# Patient Record
Sex: Female | Born: 1955 | Race: White | Hispanic: Yes | Marital: Married | Smoking: Never smoker
Health system: Southern US, Community
[De-identification: ages and names within clinical notes are randomized; demographics above are authoritative.]

## PROBLEM LIST (undated history)

## (undated) DIAGNOSIS — E669 Obesity, unspecified: Secondary | ICD-10-CM

## (undated) DIAGNOSIS — F32A Depression, unspecified: Secondary | ICD-10-CM

## (undated) DIAGNOSIS — R42 Dizziness and giddiness: Secondary | ICD-10-CM

## (undated) DIAGNOSIS — E7800 Pure hypercholesterolemia, unspecified: Secondary | ICD-10-CM

## (undated) DIAGNOSIS — E1169 Type 2 diabetes mellitus with other specified complication: Secondary | ICD-10-CM

## (undated) DIAGNOSIS — M539 Dorsopathy, unspecified: Secondary | ICD-10-CM

## (undated) DIAGNOSIS — M1990 Unspecified osteoarthritis, unspecified site: Secondary | ICD-10-CM

## (undated) DIAGNOSIS — R9431 Abnormal electrocardiogram [ECG] [EKG]: Secondary | ICD-10-CM

## (undated) DIAGNOSIS — F329 Major depressive disorder, single episode, unspecified: Secondary | ICD-10-CM

## (undated) DIAGNOSIS — T753XXA Motion sickness, initial encounter: Secondary | ICD-10-CM

## (undated) HISTORY — DX: Abnormal electrocardiogram (ECG) (EKG): R94.31

## (undated) HISTORY — DX: Pure hypercholesterolemia, unspecified: E78.00

## (undated) HISTORY — DX: Type 2 diabetes mellitus with other specified complication: E11.69

## (undated) HISTORY — PX: ABDOMINAL HYSTERECTOMY: SHX81

## (undated) HISTORY — PX: EYE SURGERY: SHX253

## (undated) HISTORY — DX: Obesity, unspecified: E66.9

---

## 1998-10-05 HISTORY — PX: PARTIAL HYSTERECTOMY: SHX80

## 2005-02-16 ENCOUNTER — Ambulatory Visit: Payer: Self-pay

## 2005-02-16 ENCOUNTER — Ambulatory Visit: Payer: Self-pay | Admitting: Internal Medicine

## 2006-08-03 ENCOUNTER — Other Ambulatory Visit: Payer: Self-pay

## 2006-08-03 ENCOUNTER — Emergency Department: Payer: Self-pay | Admitting: General Practice

## 2014-01-26 LAB — HM COLONOSCOPY

## 2016-07-02 ENCOUNTER — Emergency Department
Admission: EM | Admit: 2016-07-02 | Discharge: 2016-07-02 | Disposition: A | Discharge status: Home / Self Care | Payer: 59 | Attending: Emergency Medicine | Admitting: Emergency Medicine

## 2016-07-02 ENCOUNTER — Encounter: Payer: Self-pay | Admitting: *Deleted

## 2016-07-02 DIAGNOSIS — M25551 Pain in right hip: Secondary | ICD-10-CM | POA: Insufficient documentation

## 2016-07-02 HISTORY — DX: Major depressive disorder, single episode, unspecified: F32.9

## 2016-07-02 HISTORY — DX: Depression, unspecified: F32.A

## 2016-07-02 MED ORDER — KETOROLAC TROMETHAMINE 60 MG/2ML IM SOLN
60.0000 mg | Freq: Once | INTRAMUSCULAR | Status: AC
  Administered 2016-07-02: 60 mg via INTRAMUSCULAR
  Filled 2016-07-02: qty 2

## 2016-07-02 MED ORDER — METHOCARBAMOL 750 MG PO TABS: 750.0000 mg | ORAL_TABLET | Freq: Four times a day (QID) | ORAL | 0 refills | Status: DC

## 2016-07-02 MED ORDER — OXYCODONE-ACETAMINOPHEN 5-325 MG PO TABS: 1.0000 | ORAL_TABLET | ORAL | 0 refills | Status: DC | PRN

## 2016-07-02 NOTE — ED Provider Notes (Signed)
[facility] Emergency Department Provider Note   ____________________________________________   First MD Initiated Contact with Patient 07/02/16 1227     (approximate)  I have reviewed the triage vital signs and the nursing notes.   HISTORY  Chief Complaint Hip Pain    HPI Anne Woodard is a 60 y.o. female resents for evaluationof continuous right hip pain radiating to her groin. Patient reports past medical history significant for chronic pain. Saw her PCP 3 weeks ago had x-rays which showed some degenerative changes otherwise unremarkable. States that the naproxen and tramadol are not helping her pain.   Past Medical History:  Diagnosis Date  . Depression     There are no active problems to display for this patient.   History reviewed. No pertinent surgical history.  Prior to Admission medications   Medication Sig Start Date End Date Taking? Authorizing Provider  methocarbamol (ROBAXIN) 750 MG tablet Take 1 tablet (750 mg total) by mouth 4 (four) times daily. 07/02/16   Evangeline Dakinharles M Thorin Starner, PA-C  oxyCODONE-acetaminophen (ROXICET) 5-325 MG tablet Take 1-2 tablets by mouth every 4 (four) hours as needed for severe pain. 07/02/16   Evangeline Dakinharles M Lexis Potenza, PA-C    Allergies Review of patient's allergies indicates not on file.  No family history on file.  Social History Social History  Substance Use Topics  . Smoking status: Never Smoker  . Smokeless tobacco: Not on file  . Alcohol use No    Review of Systems Constitutional: No fever/chills Musculoskeletal: Positive straight leg raise to the right. Increased pain with extension and abduction. Skin: Negative for rash. Neurological: Negative for headaches, focal weakness or numbness.  10-point ROS otherwise negative.  ____________________________________________   PHYSICAL EXAM:  VITAL SIGNS: ED Triage Vitals  Enc Vitals Group     BP 07/02/16 1134 (!) 187/90     Pulse Rate 07/02/16 1134 71      Resp 07/02/16 1134 20     Temp 07/02/16 1134 97.8 F (36.6 C)     Temp src --      SpO2 07/02/16 1134 98 %     Weight 07/02/16 1135 185 lb (83.9 kg)     Height 07/02/16 1135 5\' 2"  (1.575 m)     Head Circumference --      Peak Flow --      Pain Score 07/02/16 1131 10     Pain Loc --      Pain Edu? --      Excl. in GC? --     Constitutional: Alert and oriented. Well appearing and in no acute distress. Gastrointestinal: Soft and nontender. No distention. No abdominal bruits. No CVA tenderness. MusculoskeletalRight hip and pelvis with point tenderness within the right groin itself no pelvic rock tenderness. Pain increased with straight leg raise and abduction of the right leg. Neurologic:  Normal speech and language. No gross focal neurologic deficits are appreciated. No gait instability. Skin:  Skin is warm, dry and intact. No rash noted. Psychiatric: Mood and affect are normal. Speech and behavior are normal.  ____________________________________________   LABS (all labs ordered are listed, but only abnormal results are displayed)  Labs Reviewed - No data to display ____________________________________________  EKG   ____________________________________________  RADIOLOGY   ____________________________________________   PROCEDURES  Procedure(s) performed: None  Procedures  Critical Care performed: No  ____________________________________________   INITIAL IMPRESSION / ASSESSMENT AND PLAN / ED COURSE  Pertinent labs & imaging results that were available during my care  of the patient were reviewed by me and considered in my medical decision making (see chart for details).  Recurrent right hip pain. Reinforce affect the patient to follow-up with orthopedics for evaluation of continued right hip pain. Rx given for Percocet and Robaxin. Patient voices no other emergency medical complaints this time. She is to stop her tramadol.  Clinical Course      ____________________________________________   FINAL CLINICAL IMPRESSION(S) / ED DIAGNOSES  Final diagnoses:  Hip pain, acute, right      NEW MEDICATIONS STARTED DURING THIS VISIT:  New Prescriptions   METHOCARBAMOL (ROBAXIN) 750 MG TABLET    Take 1 tablet (750 mg total) by mouth 4 (four) times daily.   OXYCODONE-ACETAMINOPHEN (ROXICET) 5-325 MG TABLET    Take 1-2 tablets by mouth every 4 (four) hours as needed for severe pain.     Note:  This document was prepared using Dragon voice recognition software and may include unintentional dictation errors.   Evangeline Dakin, PA-C 07/02/16 1307    Sharman Cheek, MD 07/02/16 [id number]

## 2016-07-02 NOTE — ED Notes (Signed)
Patient stated pain is unchanged after injection. Advised patient to have prescriptions filled and take. Advised caution with sedation from medications.

## 2016-07-02 NOTE — ED Triage Notes (Signed)
Pt complains of right hip pain radiating to groin, pt reports chronic hip pain

## 2017-03-09 ENCOUNTER — Other Ambulatory Visit: Payer: Self-pay | Admitting: Internal Medicine

## 2017-03-09 DIAGNOSIS — Z1231 Encounter for screening mammogram for malignant neoplasm of breast: Secondary | ICD-10-CM

## 2017-03-23 ENCOUNTER — Ambulatory Visit
Admission: RE | Admit: 2017-03-23 | Discharge: 2017-03-23 | Disposition: A | Discharge status: Home / Self Care | Payer: 59 | Source: Ambulatory Visit | Attending: Internal Medicine | Admitting: Internal Medicine

## 2017-03-23 DIAGNOSIS — Z1231 Encounter for screening mammogram for malignant neoplasm of breast: Secondary | ICD-10-CM | POA: Diagnosis present

## 2017-03-25 ENCOUNTER — Other Ambulatory Visit: Payer: Self-pay | Admitting: Internal Medicine

## 2017-03-25 DIAGNOSIS — R928 Other abnormal and inconclusive findings on diagnostic imaging of breast: Secondary | ICD-10-CM

## 2017-03-31 ENCOUNTER — Ambulatory Visit
Admission: RE | Admit: 2017-03-31 | Discharge: 2017-03-31 | Disposition: A | Discharge status: Home / Self Care | Payer: 59 | Source: Ambulatory Visit | Attending: Internal Medicine | Admitting: Internal Medicine

## 2017-03-31 DIAGNOSIS — R928 Other abnormal and inconclusive findings on diagnostic imaging of breast: Secondary | ICD-10-CM | POA: Diagnosis present

## 2017-08-03 ENCOUNTER — Ambulatory Visit
Admission: RE | Admit: 2017-08-03 | Discharge: 2017-08-03 | Disposition: A | Discharge status: Home / Self Care | Payer: 59 | Source: Ambulatory Visit | Attending: Internal Medicine | Admitting: Internal Medicine

## 2017-08-03 ENCOUNTER — Other Ambulatory Visit: Payer: Self-pay | Admitting: Internal Medicine

## 2017-08-03 DIAGNOSIS — R52 Pain, unspecified: Secondary | ICD-10-CM

## 2018-01-14 ENCOUNTER — Encounter: Payer: Self-pay | Admitting: Cardiovascular Disease

## 2018-01-14 ENCOUNTER — Ambulatory Visit (INDEPENDENT_AMBULATORY_CARE_PROVIDER_SITE_OTHER): Payer: 59 | Admitting: Cardiovascular Disease

## 2018-01-14 VITALS — BP 124/80 | HR 79 | Ht 62.0 in | Wt 191.0 lb

## 2018-01-14 DIAGNOSIS — E1169 Type 2 diabetes mellitus with other specified complication: Secondary | ICD-10-CM | POA: Diagnosis not present

## 2018-01-14 DIAGNOSIS — E669 Obesity, unspecified: Secondary | ICD-10-CM

## 2018-01-14 DIAGNOSIS — R9431 Abnormal electrocardiogram [ECG] [EKG]: Secondary | ICD-10-CM

## 2018-01-14 DIAGNOSIS — R0602 Shortness of breath: Secondary | ICD-10-CM

## 2018-01-14 DIAGNOSIS — I517 Cardiomegaly: Secondary | ICD-10-CM | POA: Diagnosis not present

## 2018-01-14 DIAGNOSIS — E7800 Pure hypercholesterolemia, unspecified: Secondary | ICD-10-CM

## 2018-01-14 HISTORY — DX: Pure hypercholesterolemia, unspecified: E78.00

## 2018-01-14 HISTORY — DX: Abnormal electrocardiogram (ECG) (EKG): R94.31

## 2018-01-14 HISTORY — DX: Type 2 diabetes mellitus with other specified complication: E66.9

## 2018-01-14 HISTORY — DX: Type 2 diabetes mellitus with other specified complication: E11.69

## 2018-01-14 NOTE — Patient Instructions (Addendum)
Medication Instructions:  No medication Changes  Labwork: Your physician recommends that you return for lab work in: 6 months (Lipids, CMP)   Testing/Procedures: Your physician has requested that you have en exercise stress myoview. For further information please visit [email or web address]. Please follow instruction sheet, as given. [facility] [address]  Your physician has requested that you have an echocardiogram. Echocardiography is a painless test that uses sound waves to create images of your heart. It provides your doctor with information about the size and shape of your heart and how well your heart's chambers and valves are working. This procedure takes approximately one hour. There are no restrictions for this procedure. [facility] [address] [place]    Follow-Up: Your physician wants you to follow-up in: 6 month with Dr. Fara Chuteandolph You will receive a reminder letter in the mail two months in advance. If you don't receive a letter, please call our office to schedule the follow-up appointment.   Any Other Special Instructions Will Be Listed Below (If Applicable). Increase exercise to 150 minutes a week Watch greasy fatty foods.  Echocardiogram An echocardiogram, or echocardiography, uses sound waves (ultrasound) to produce an image of your heart. The echocardiogram is simple, painless, obtained within a short period of time, and offers valuable information to your health care provider. The images from an echocardiogram can provide information such as:  Evidence of coronary artery disease (CAD).  Heart size.  Heart muscle function.  Heart valve function.  Aneurysm detection.  Evidence of a past heart attack.  Fluid buildup around the heart.  Heart muscle thickening.  Assess heart valve function.  Tell a health care provider about:  Any allergies you have.  All medicines you are taking, including vitamins, herbs, eye drops,  creams, and over-the-counter medicines.  Any problems you or family members have had with anesthetic medicines.  Any blood disorders you have.  Any surgeries you have had.  Any medical conditions you have.  Whether you are pregnant or may be pregnant. What happens before the procedure? No special preparation is needed. Eat and drink normally. What happens during the procedure?  In order to produce an image of your heart, gel will be applied to your chest and a wand-like tool (transducer) will be moved over your chest. The gel will help transmit the sound waves from the transducer. The sound waves will harmlessly bounce off your heart to allow the heart images to be captured in real-time motion. These images will then be recorded.  You may need an IV to receive a medicine that improves the quality of the pictures. What happens after the procedure? You may return to your normal schedule including diet, activities, and medicines, unless your health care provider tells you otherwise. This information is not intended to replace advice given to you by your health care provider. Make sure you discuss any questions you have with your health care provider. Document Released: 09/18/2000 Document Revised: 05/09/2016 Document Reviewed: 05/29/2013 Elsevier Interactive Patient Education  2017 Elsevier Inc.   Cardiac Nuclear Scan A cardiac nuclear scan is a test that measures blood flow to the heart when a person is resting and when he or she is exercising. The test looks for problems such as:  Not enough blood reaching a portion of the heart.  The heart muscle not working normally.  You may need this test if:  You have heart disease.  You have had abnormal lab results.  You have had heart  surgery or angioplasty.  You have chest pain.  You have shortness of breath.  In this test, a radioactive dye (tracer) is injected into your bloodstream. After the tracer has traveled to your heart,  an imaging device is used to measure how much of the tracer is absorbed by or distributed to various areas of your heart. This procedure is usually done at a hospital and takes 2-4 hours. Tell a health care provider about:  Any allergies you have.  All medicines you are taking, including vitamins, herbs, eye drops, creams, and over-the-counter medicines.  Any problems you or family members have had with the use of anesthetic medicines.  Any blood disorders you have.  Any surgeries you have had.  Any medical conditions you have.  Whether you are pregnant or may be pregnant. What are the risks? Generally, this is a safe procedure. However, problems may occur, including:  Serious chest pain and heart attack. This is only a risk if the stress portion of the test is done.  Rapid heartbeat.  Sensation of warmth in your chest. This usually passes quickly.  What happens before the procedure?  Ask your health care provider about changing or stopping your regular medicines. This is especially important if you are taking diabetes medicines or blood thinners.  Remove your jewelry on the day of the procedure. What happens during the procedure?  An IV tube will be inserted into one of your veins.  Your health care provider will inject a small amount of radioactive tracer through the tube.  You will wait for 20-40 minutes while the tracer travels through your bloodstream.  Your heart activity will be monitored with an electrocardiogram (ECG).  You will lie down on an exam table.  Images of your heart will be taken for about 15-20 minutes.  You may be asked to exercise on a treadmill or stationary bike. While you exercise, your heart's activity will be monitored with an ECG, and your blood pressure will be checked. If you are unable to exercise, you may be given a medicine to increase blood flow to parts of your heart.  When blood flow to your heart has peaked, a tracer will again be  injected through the IV tube.  After 20-40 minutes, you will get back on the exam table and have more images taken of your heart.  When the procedure is over, your IV tube will be removed. The procedure may vary among health care providers and hospitals. Depending on the type of tracer used, scans may need to be repeated 3-4 hours later. What happens after the procedure?  Unless your health care provider tells you otherwise, you may return to your normal schedule, including diet, activities, and medicines.  Unless your health care provider tells you otherwise, you may increase your fluid intake. This will help flush the contrast dye from your body. Drink enough fluid to keep your urine clear or pale yellow.  It is up to you to get your test results. Ask your health care provider, or the department that is doing the test, when your results will be ready. Summary  A cardiac nuclear scan measures the blood flow to the heart when a person is resting and when he or she is exercising.  You may need this test if you are at risk for heart disease.  Tell your health care provider if you are pregnant.  Unless your health care provider tells you otherwise, increase your fluid intake. This will help flush the  contrast dye from your body. Drink enough fluid to keep your urine clear or pale yellow. This information is not intended to replace advice given to you by your health care provider. Make sure you discuss any questions you have with your health care provider. Document Released: 10/16/2004 Document Revised: 09/23/2016 Document Reviewed: 08/30/2013 Elsevier Interactive Patient Education  2017 [employer].      If you need a refill on your cardiac medications before your next appointment, please call your pharmacy.

## 2018-01-14 NOTE — Progress Notes (Signed)
Cardiology Office Note   Date:  01/14/2018   ID:  Geroge BasemanMonica S Koskela, DOB 1956-06-27, MRN [id number]  PCP:  Dorothyann PengSanders, Robyn, MD  Cardiologist:   Chilton Siiffany Edgefield, MD   Chief Complaint  Patient presents with  . Follow-up  . Shortness of Breath      History of Present Illness: Geroge BasemanMonica S Peabody is a 62 y.o. female with hyperlipidemia, and diabetes who is being seen today for the evaluation of abnormal EKG at the request of Dorothyann PengSanders, Robyn, MD.  It was noted to have an abnormal EKG.  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    Ms. Robin SearingLyall saw Dr. Allyne GeeSanders 01/2018 she had [facility].  No additional testing was needed at that time.  Ms. Robin SearingLyall reports some shortness of breath is been ongoing for the last week.  She denies any chest pain or pressure.  She has been under a lot of stress lately.  Her mother has terminal cancer and she visits her daily.  Her son was also recently diagnosed with multiple sclerosis in the setting of new onset seizures.  Diabetes.  Because of the stressors she has not been getting much exercise.  She was recently diagnosed with she denies any lower extremity edema, orthopnea, or PND.  She has been trying to work on her diet by limiting carbohydrates and sodium.   Past Medical History:  Diagnosis Date  . Abnormal EKG 01/14/2018  . Depression   . Diabetes mellitus type 2 in obese (HCC) 01/14/2018  . Pure hypercholesterolemia 01/14/2018    History reviewed. No pertinent surgical history.   Current Outpatient Medications  Medication Sig Dispense Refill  . clonazePAM (KLONOPIN) 0.5 MG tablet Take 0.5 mg by mouth 2 (two) times daily as needed.    Marland Kitchen. escitalopram (LEXAPRO) 10 MG tablet Take 10 mg by mouth daily.     No current facility-administered medications for this visit.     Allergies:   Patient has no known allergies.    Social History:  The patient   reports that she has never smoked. She has quit using smokeless tobacco. She reports that she does not drink alcohol.   Family History:  The patient's family history includes Alzheimer's disease in her father; Anxiety disorder in her son; Colon cancer in her mother; Depression in her son; Endometrial cancer in her mother; Hypertension in her mother and son; Multiple sclerosis in her son.    ROS:  Please see the history of present illness.   Otherwise, review of systems are positive for none.   All other systems are reviewed and negative.    PHYSICAL EXAM: VS:  BP 124/80 (BP Location: Left Arm, Patient Position: Sitting, Cuff Size: Large)   Pulse 79   Ht 5\' 2"  (1.575 m)   Wt 191 lb (86.6 kg)   BMI 34.93 kg/m  , BMI Body mass index is 34.93 kg/m. GENERAL:  Well appearing HEENT:  Pupils equal round and reactive, fundi not visualized, oral mucosa unremarkable NECK:  No jugular venous distention, waveform within normal limits, carotid upstroke brisk and symmetric, no bruits LUNGS:  Clear to auscultation bilaterally HEART:  RRR.  PMI not displaced or sustained,S1 and S2 within normal limits, no S3, no S4, no clicks, no rubs, no murmurs ABD:  Flat, positive bowel sounds normal in frequency in pitch, no bruits, no rebound, no guarding, no midline pulsatile mass, no hepatomegaly, no  splenomegaly EXT:  2 plus pulses throughout, no edema, no cyanosis no clubbing SKIN:  No rashes no nodules NEURO:  Cranial nerves II through XII grossly intact, motor grossly intact throughout PSYCH:  Cognitively intact, oriented to person place and time   EKG:  EKG is ordered today. The ekg ordered today demonstrates sinus rhythm.  Rate 79 bpm.  Right axis deviation.  RV H.  Prior inferior infarct.   Recent Labs: No results found for requested labs within last 8760 hours.   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  She reports increasing shortness of breath and is concerned that she may have CAD.  We will get an exercise Myoview to assess for ischemia.   # Shortness of breath: # RVH/RAD: We will get an echo to evaluate for evidence of right sided heart disease.  There is no evidence of DVT/PE and no evidence of OSA.   Current medicines are reviewed at length with the patient today.  The patient does not have concerns regarding medicines.  The following changes have been made:  no change  Labs/ tests ordered today include:   Orders Placed This Encounter  Procedures  . Lipid panel  . Comprehensive metabolic panel  . Myocardial Perfusion Imaging  . EKG 12-Lead  . ECHOCARDIOGRAM COMPLETE     Disposition:   FU with Ellisyn Icenhower C. Duke Salvia, MD, [facility]     This note was written with the assistance of speech recognition software.  Please excuse any transcriptional errors.  Signed, Bosco Paparella C. Duke Salvia, MD, [facility]  01/14/2018 6:50 PM    [facility]

## 2018-01-17 ENCOUNTER — Encounter: Payer: Self-pay | Admitting: *Deleted

## 2018-01-18 ENCOUNTER — Other Ambulatory Visit (HOSPITAL_COMMUNITY): Payer: 59

## 2018-01-21 ENCOUNTER — Ambulatory Visit (HOSPITAL_COMMUNITY): Payer: 59 | Attending: Cardiovascular Disease

## 2018-01-21 ENCOUNTER — Other Ambulatory Visit: Payer: Self-pay

## 2018-01-21 DIAGNOSIS — E119 Type 2 diabetes mellitus without complications: Secondary | ICD-10-CM | POA: Diagnosis not present

## 2018-01-21 DIAGNOSIS — R0602 Shortness of breath: Secondary | ICD-10-CM

## 2018-01-21 DIAGNOSIS — R9431 Abnormal electrocardiogram [ECG] [EKG]: Secondary | ICD-10-CM

## 2018-01-21 DIAGNOSIS — I517 Cardiomegaly: Secondary | ICD-10-CM

## 2018-01-21 DIAGNOSIS — E785 Hyperlipidemia, unspecified: Secondary | ICD-10-CM | POA: Diagnosis not present

## 2018-01-25 ENCOUNTER — Telehealth (HOSPITAL_COMMUNITY): Payer: Self-pay

## 2018-01-25 NOTE — Telephone Encounter (Signed)
Encounter complete. 

## 2018-01-27 ENCOUNTER — Ambulatory Visit (HOSPITAL_COMMUNITY)
Admission: RE | Admit: 2018-01-27 | Discharge: 2018-01-27 | Disposition: A | Discharge status: Home / Self Care | Payer: 59 | Source: Ambulatory Visit | Attending: Cardiology | Admitting: Cardiology

## 2018-01-27 DIAGNOSIS — I4510 Unspecified right bundle-branch block: Secondary | ICD-10-CM | POA: Insufficient documentation

## 2018-01-27 DIAGNOSIS — E669 Obesity, unspecified: Secondary | ICD-10-CM | POA: Insufficient documentation

## 2018-01-27 DIAGNOSIS — R0602 Shortness of breath: Secondary | ICD-10-CM | POA: Insufficient documentation

## 2018-01-27 DIAGNOSIS — Z6834 Body mass index (BMI) 34.0-34.9, adult: Secondary | ICD-10-CM | POA: Insufficient documentation

## 2018-01-27 DIAGNOSIS — R9431 Abnormal electrocardiogram [ECG] [EKG]: Secondary | ICD-10-CM

## 2018-01-27 DIAGNOSIS — R5383 Other fatigue: Secondary | ICD-10-CM | POA: Diagnosis not present

## 2018-01-27 LAB — MYOCARDIAL PERFUSION IMAGING
CHL CUP MPHR: 157 {beats}/min
CHL CUP NUCLEAR SDS: 0
CHL CUP RESTING HR STRESS: 64 {beats}/min
CSEPED: 8 min
CSEPEDS: 30 s
CSEPHR: 95 %
Estimated workload: 8.9 METS
LV dias vol: 72 mL (ref 46–106)
LV sys vol: 25 mL
Peak HR: 150 {beats}/min
RPE: 18
SRS: 0
SSS: 0
TID: 1.11

## 2018-01-27 MED ORDER — TECHNETIUM TC 99M TETROFOSMIN IV KIT
30.6000 | PACK | Freq: Once | INTRAVENOUS | Status: AC | PRN
  Administered 2018-01-27: 30.6 via INTRAVENOUS
  Filled 2018-01-27: qty 31

## 2018-01-27 MED ORDER — TECHNETIUM TC 99M TETROFOSMIN IV KIT
9.6000 | PACK | Freq: Once | INTRAVENOUS | Status: AC | PRN
  Administered 2018-01-27: 9.6 via INTRAVENOUS
  Filled 2018-01-27: qty 10

## 2018-02-13 NOTE — Progress Notes (Signed)
Cardiology Office Note   Date:  02/14/2018   ID:  Anne Woodard, DOB Mar 08, 1956, MRN [id number]  PCP:  Anne Peng, MD  Cardiologist: Dr. Duke Salvia   Chief Complaint  Patient presents with  . Follow-up     History of Present Illness: Anne Woodard is a 62 y.o. female who presents for follow-up after being seen for the first time by Dr. Chilton Si on 01/14/2018, due to abnormal EKG.  During the first office visit the patient reported some shortness of breath but without complaints of chest pain.    Patient has been under a great deal of stress due to terminal illness and her mother and disabled son. 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   Due to abnormal EKG, stress test was ordered along with an echocardiogram.   Stress test completed on 01/27/2018 was normal without evidence of ischemia. Echo was normal as well, with EF of 60%-65%, without WMA.   She comes today with noted BP elevation. She states that she has noticed BP higher over the last few months due to increased stress. She also is very sedentary driving a lot to her patients and charting, also eats out a lot. She is feeling more fatigued. She is unhappy with her weight.    Past Medical History:  Diagnosis Date  . Abnormal EKG 01/14/2018  . Depression   . Diabetes mellitus type 2 in obese (HCC) 01/14/2018  . Pure hypercholesterolemia 01/14/2018    No past surgical history on file.   Current Outpatient Medications  Medication Sig Dispense Refill  . clonazePAM (KLONOPIN) 0.5 MG tablet Take 0.5 mg by mouth 2 (two) times daily as needed.    Marland Kitchen escitalopram (LEXAPRO) 10 MG tablet Take 10 mg by mouth daily.    . hydrochlorothiazide (MICROZIDE) 12.5 MG capsule Take 1 capsule (12.5 mg total) by mouth daily. 30 capsule 3  . losartan (COZAAR) 25 MG tablet Take 1 tablet (25 mg total) by mouth daily. 30 tablet 3   No current facility-administered medications for this visit.     Allergies:   Patient has no known  allergies.    Social History:  The patient  reports that she has never smoked. She has quit using smokeless tobacco. She reports that she does not drink alcohol.   Family History:  The patient's family history includes Alzheimer's disease in her father; Anxiety disorder in her son; Colon cancer in her mother; Depression in her son; Endometrial cancer in her mother; Hypertension in her mother and son; Multiple sclerosis in her son.    ROS: All other systems are reviewed and negative. Unless otherwise mentioned in H&P    PHYSICAL EXAM: VS:  BP (!) 152/90   Pulse 76   Ht  (1.575 m)   Wt 192 lb 6.4 oz (87.3 kg)   BMI 35.19 kg/m  , BMI Body mass index is 35.19 kg/m. GEN: Well nourished, well developed, in no acute distress  HEENT: normal  Neck: no JVD, carotid bruits, or masses Cardiac: RRR; no murmurs, rubs, or gallops,no edema  Respiratory:  clear to auscultation bilaterally, normal work of breathing GI: soft, nontender, nondistended, + BS MS: no deformity or atrophy  Skin: warm and dry, no rash Neuro:  Strength and sensation are intact Psych: euthymic mood, full affect   EKG: Not completed during this office visit.   Recent Labs: No results found for requested labs within last 8760 hours.    Lipid Panel No results  found for: CHOL, TRIG, HDL, CHOLHDL, VLDL, LDLCALC, LDLDIRECT    Wt Readings from Last 3 Encounters:  02/14/18 192 lb 6.4 oz (87.3 kg)  01/27/18 191 lb (86.6 kg)  01/14/18 191 lb (86.6 kg)      Other studies Reviewed: As above   ASSESSMENT AND PLAN:  1. Hypertension: Multifactorial in the setting of stress, obesity, and high salt diet. Will begin losartan 25 mg with HCTZ 12.5 mg daily. She will keep up with her BP at home and record. Will have her come back in a month for assessment and response to medications.   2. Hypercholesterolemia: I have reviewed her recent lipid panel 01/2018. TC 225; HDL 50; LDL 145. I have recommended that she begin statin  regimen. She does not wish to begin on this yet and will try to change her eating habits. If remains elevated in 3 months will need to start therapy. She verbalizes understanding.   3. Situational stress: Followed by PCP.   Current medicines are reviewed at length with the patient today.    Labs/ tests ordered today include: None  Anne Woodard. Anne Woodard, ANP, AACC   02/14/2018 12:15 PM    [facility] [address], [place], [place] [address] Phone: [phone]; Fax: [phone]

## 2018-02-14 ENCOUNTER — Ambulatory Visit (INDEPENDENT_AMBULATORY_CARE_PROVIDER_SITE_OTHER): Payer: 59 | Admitting: Adult Health

## 2018-02-14 ENCOUNTER — Encounter: Payer: Self-pay | Admitting: Adult Health

## 2018-02-14 VITALS — BP 152/90 | HR 76 | Ht 62.0 in | Wt 192.4 lb

## 2018-02-14 DIAGNOSIS — Z79899 Other long term (current) drug therapy: Secondary | ICD-10-CM

## 2018-02-14 DIAGNOSIS — I10 Essential (primary) hypertension: Secondary | ICD-10-CM | POA: Diagnosis not present

## 2018-02-14 DIAGNOSIS — E7800 Pure hypercholesterolemia, unspecified: Secondary | ICD-10-CM | POA: Diagnosis not present

## 2018-02-14 MED ORDER — LOSARTAN POTASSIUM 25 MG PO TABS: 25.0000 mg | ORAL_TABLET | Freq: Every day | ORAL | 3 refills | Status: DC

## 2018-02-14 MED ORDER — HYDROCHLOROTHIAZIDE 12.5 MG PO CAPS: 12.5000 mg | ORAL_CAPSULE | Freq: Every day | ORAL | 3 refills | Status: DC

## 2018-02-14 NOTE — Patient Instructions (Signed)
Medication Instructions:  Start: Losartan 25 mg Daily           HCTZ 12.5 mg Daily  Labwork: Your physician recommends that you return for lab work in: 1 month ( BMET)   Testing/Procedures: None  Follow-Up: Your physician recommends that you schedule a follow-up appointment in: 1 month with Dr. Joni Reining, NP   Any Other Special Instructions Will Be Listed Below (If Applicable).     If you need a refill on your cardiac medications before your next appointment, please call your pharmacy.

## 2018-03-14 ENCOUNTER — Telehealth: Payer: Self-pay | Admitting: Adult Health

## 2018-03-14 NOTE — Telephone Encounter (Signed)
Returned the call to the patient. She stated that she has discontinued the Losartan 25 mg and the Hydrochlorothiazide 12.5 mg. She stated that the medication made her dizzy and she has a job that requires her to bend a lot. She tried taking each medication separately but she was still having dizzy spells.  She stated that she will follow up with her PCP concerning her blood pressure and medication and requested that we cancel her follow up appointment.

## 2018-03-14 NOTE — Telephone Encounter (Signed)
Pt calling to let us know that she stopped taking both BP meds due to dizziness. She has been off for 2 weeks now. Still having dizziness but she states she has chronic dizziness so it happens a lot but the med made it worse-pls advise [phone]

## 2018-03-14 NOTE — Telephone Encounter (Signed)
Thank you for the information.

## 2018-03-24 ENCOUNTER — Ambulatory Visit: Payer: 59 | Admitting: Adult Health

## 2018-06-29 ENCOUNTER — Encounter: Payer: Self-pay | Admitting: Emergency Medicine

## 2018-06-29 ENCOUNTER — Other Ambulatory Visit: Payer: Self-pay

## 2018-06-29 ENCOUNTER — Emergency Department
Admission: EM | Admit: 2018-06-29 | Discharge: 2018-06-29 | Disposition: A | Discharge status: Home / Self Care | Payer: 59 | Attending: Emergency Medicine | Admitting: Emergency Medicine

## 2018-06-29 ENCOUNTER — Emergency Department: Payer: 59

## 2018-06-29 DIAGNOSIS — G4489 Other headache syndrome: Secondary | ICD-10-CM

## 2018-06-29 DIAGNOSIS — R51 Headache: Secondary | ICD-10-CM | POA: Insufficient documentation

## 2018-06-29 DIAGNOSIS — Z79899 Other long term (current) drug therapy: Secondary | ICD-10-CM | POA: Insufficient documentation

## 2018-06-29 DIAGNOSIS — M542 Cervicalgia: Secondary | ICD-10-CM | POA: Diagnosis not present

## 2018-06-29 DIAGNOSIS — B349 Viral infection, unspecified: Secondary | ICD-10-CM

## 2018-06-29 DIAGNOSIS — E119 Type 2 diabetes mellitus without complications: Secondary | ICD-10-CM | POA: Insufficient documentation

## 2018-06-29 LAB — COMPREHENSIVE METABOLIC PANEL
ALT: 45 U/L — ABNORMAL HIGH (ref 0–44)
ANION GAP: 9 (ref 5–15)
AST: 37 U/L (ref 15–41)
Albumin: 4.1 g/dL (ref 3.5–5.0)
Alkaline Phosphatase: 99 U/L (ref 38–126)
BUN: 16 mg/dL (ref 8–23)
CO2: 24 mmol/L (ref 22–32)
Calcium: 8.9 mg/dL (ref 8.9–10.3)
Chloride: 107 mmol/L (ref 98–111)
Creatinine, Ser: 0.74 mg/dL (ref 0.44–1.00)
GFR calc non Af Amer: >60 mL/min (ref >60)
Glucose, Bld: 202 mg/dL — ABNORMAL HIGH (ref 70–99)
POTASSIUM: 4 mmol/L (ref 3.5–5.1)
Sodium: 140 mmol/L (ref 135–145)
Total Bilirubin: 0.9 mg/dL (ref 0.3–1.2)
Total Protein: 8 g/dL (ref 6.5–8.1)

## 2018-06-29 LAB — CBC WITH DIFFERENTIAL/PLATELET
Basophils Absolute: 0.1 10*3/uL (ref 0–0.1)
Basophils Relative: 1 %
EOS PCT: 2 %
Eosinophils Absolute: 0.1 10*3/uL (ref 0–0.7)
HCT: 39.8 % (ref 35.0–47.0)
Hemoglobin: 13.8 g/dL (ref 12.0–16.0)
LYMPHS ABS: 2.2 10*3/uL (ref 1.0–3.6)
Lymphocytes Relative: 37 %
MCH: 31.5 pg (ref 26.0–34.0)
MCHC: 34.8 g/dL (ref 32.0–36.0)
MCV: 90.6 fL (ref 80.0–100.0)
MONO ABS: 0.5 10*3/uL (ref 0.2–0.9)
Monocytes Relative: 8 %
NEUTROS ABS: 3.1 10*3/uL (ref 1.4–6.5)
Neutrophils Relative %: 52 %
PLATELETS: 218 10*3/uL (ref 150–440)
RBC: 4.4 MIL/uL (ref 3.80–5.20)
RDW: 13.2 % (ref 11.5–14.5)
WBC: 6 10*3/uL (ref 3.6–11.0)

## 2018-06-29 MED ORDER — METOCLOPRAMIDE HCL 5 MG/ML IJ SOLN
10.0000 mg | Freq: Once | INTRAMUSCULAR | Status: AC
  Administered 2018-06-29: 10 mg via INTRAVENOUS
  Filled 2018-06-29: qty 2

## 2018-06-29 MED ORDER — SODIUM CHLORIDE 0.9 % IV BOLUS
1000.0000 mL | Freq: Once | INTRAVENOUS | Status: AC
  Administered 2018-06-29: 1000 mL via INTRAVENOUS

## 2018-06-29 MED ORDER — DIPHENHYDRAMINE HCL 50 MG/ML IJ SOLN
25.0000 mg | Freq: Once | INTRAMUSCULAR | Status: AC
  Administered 2018-06-29: 25 mg via INTRAVENOUS
  Filled 2018-06-29: qty 1

## 2018-06-29 NOTE — ED Triage Notes (Addendum)
Pt comes into the ED via POV c/o headache and chills that started on Saturday.  Patient denies any h/o migraines.  Patient states she has had recent travels on a plane and urgent care sent over here to be evaluated.  Patient states she also has a stiff neck.  Patient in NAD at this time.  Patient has even and unlabored respirations.  Patient neurologically intact at this time and has no mobility issues with her neck at this time. Denies any photosensitivity and states the pain is not horrible at this time.

## 2018-06-29 NOTE — ED Provider Notes (Signed)
[facility] EMERGENCY DEPARTMENT Provider Note   CSN: [id number] Arrival date & time: 06/29/18  1103     History   Chief Complaint Chief Complaint  Patient presents with  . Headache  . Chills    HPI Anne Woodard is a 62 y.o. female hx of DM, HL, here presenting with headache, chills.  Patient just recently went to [facility] for trip.  She states that she has intermittent headaches for the last several days.  She just got back home 4 days ago and she noticed worsening headaches and neck pain.  Has some low-grade temperature and chills but denies any actual fevers.  She denies any vomiting or history of migraines.  Patient went to urgent care initially and was sent here for further evaluation.  Patient denies any sick contacts. Patient works as a Publishing rights manager.   The history is provided by the patient.    Past Medical History:  Diagnosis Date  . Abnormal EKG 01/14/2018  . Depression   . Diabetes mellitus type 2 in obese (HCC) 01/14/2018  . Pure hypercholesterolemia 01/14/2018    Patient Active Problem List   Diagnosis Date Noted  . Abnormal EKG 01/14/2018  . Pure hypercholesterolemia 01/14/2018  . Diabetes mellitus type 2 in obese (HCC) 01/14/2018    History reviewed. No pertinent surgical history.   OB History   None      Home Medications    Prior to Admission medications   Medication Sig Start Date End Date Taking? Authorizing Provider  clonazePAM (KLONOPIN) 1 MG tablet Take 0.5 mg by mouth 2 (two) times daily as needed for anxiety. 05/06/18  Yes [provider]  escitalopram (LEXAPRO) 10 MG tablet Take 10 mg by mouth daily.   Yes [provider]  losartan (COZAAR) 25 MG tablet Take 1 tablet (25 mg total) by mouth daily. 02/14/18 06/30/19 Yes Jodelle Gross, NP  hydrochlorothiazide (MICROZIDE) 12.5 MG capsule Take 1 capsule (12.5 mg total) by mouth daily. Patient not taking: Reported on 06/29/2018 02/14/18 05/15/18   Jodelle Gross, NP    Family History Family History  Problem Relation Age of Onset  . Colon cancer Mother   . Endometrial cancer Mother   . Hypertension Mother   . Alzheimer's disease Father   . Multiple sclerosis Son   . Hypertension Son   . Depression Son   . Anxiety disorder Son   . Breast cancer Neg Hx     Social History Social History   Tobacco Use  . Smoking status: Never Smoker  . Smokeless tobacco: Former Engineer, water Use Topics  . Alcohol use: No  . Drug use: Not on file     Allergies   Patient has no known allergies.   Review of Systems Review of Systems  Neurological: Positive for headaches.  All other systems reviewed and are negative.    Physical Exam Updated Vital Signs BP (!) 150/87 (BP Location: Left Arm)   Pulse 72   Temp 98.2 F (36.8 C) (Oral)   Resp 16   Ht 5\' 3"  (1.6 m)   Wt 85.3 kg   SpO2 98%   BMI 33.30 kg/m   Physical Exam  Constitutional: She is oriented to person, place, and time. She appears well-developed and well-nourished.  HENT:  Head: Normocephalic.  Mouth/Throat: Oropharynx is clear and moist.  OP clear   Eyes: Pupils are equal, round, and reactive to light. EOM are normal.  Neck: Normal range of motion.  Neck supple.  L paracervical tenderness   Cardiovascular: Normal rate, regular rhythm and normal heart sounds.  Pulmonary/Chest: Effort normal and breath sounds normal. No respiratory distress.  Abdominal: Soft. Bowel sounds are normal.  Musculoskeletal: Normal range of motion.  Neurological: She is alert and oriented to person, place, and time. She has normal strength.  CN 2- 12 intact, nl strength throughout, nl finger to nose.   Skin: Skin is warm. Capillary refill takes less than 2 seconds.  Psychiatric: She has a normal mood and affect. Her behavior is normal.  Nursing note and vitals reviewed.    ED Treatments / Results  Labs (all labs ordered are listed, but only abnormal results are  displayed) Labs Reviewed  COMPREHENSIVE METABOLIC PANEL - Abnormal; Notable for the following components:      Result Value   Glucose, Bld 202 (*)    ALT 45 (*)    All other components within normal limits  CBC WITH DIFFERENTIAL/PLATELET    EKG None  Radiology Ct Head Wo Contrast  Result Date: 06/29/2018 CLINICAL DATA:  62 year old presenting with a 5 day history of headache and chills. Patient also complains of POSTERIOR neck pain. No recent injuries. Current history of diabetes and hypercholesterolemia. EXAM: CT HEAD WITHOUT CONTRAST CT CERVICAL SPINE WITHOUT CONTRAST TECHNIQUE: Multidetector CT imaging of the head and cervical spine was performed following the standard protocol without intravenous contrast. Multiplanar CT image reconstructions of the cervical spine were also generated. COMPARISON:  None. FINDINGS: CT HEAD FINDINGS Brain: Ventricular system normal in size and appearance for age. No mass lesion. No midline shift. No acute hemorrhage or hematoma. No extra-axial fluid collections. No evidence of acute infarction. No focal brain parenchymal abnormalities. Incidental note of partial empty sella. Vascular: No visible atherosclerosis.  No hyperdense vessel. Skull: No skull fracture or other focal osseous abnormality involving the skull. Sinuses/Orbits: Visualized paranasal sinuses, bilateral mastoid air cells and bilateral middle ear cavities well-aerated. Visualized orbits and globes normal in appearance. Other: None. CT CERVICAL SPINE FINDINGS Alignment: Anatomic POSTERIOR alignment. Straightening of the usual cervical lordosis. Skull base and vertebrae: No fractures identified involving the cervical spine. Facet joints anatomically aligned throughout with diffuse degenerative changes. Coronal reformatted images demonstrate an intact craniocervical junction, intact dens and intact lateral masses throughout. Soft tissues and spinal canal: No evidence of paraspinous or spinal canal  hematoma. No evidence of spinal stenosis. Disc levels: Severe disc space narrowing at C5-6 and C6-7 with central disc protrusions at these levels. Mild disc space narrowing at C4-5 without visible protrusion. Predominantly facet hypertrophy accounts for multilevel foraminal stenoses including: Mild LEFT C3-4, moderate LEFT C4-5, moderate BILATERAL C5-6. Upper chest: Visualized lung apices clear. Visualized superior mediastinum normal. Other: None. IMPRESSION: CT Head: 1.  Normal examination. CT Cervical Spine: 1. No cervical spine fractures identified. 2. Degenerative disc disease and spondylosis at C5-6 and C6-7 with central disc protrusions at these levels. 3. Diffuse facet degenerative changes accounting for multilevel foraminal stenoses as detailed above. Electronically Signed   By: Hulan Saas M.D.   On: 06/29/2018 14:20   Ct Cervical Spine Wo Contrast  Result Date: 06/29/2018 CLINICAL DATA:  62 year old presenting with a 5 day history of headache and chills. Patient also complains of POSTERIOR neck pain. No recent injuries. Current history of diabetes and hypercholesterolemia. EXAM: CT HEAD WITHOUT CONTRAST CT CERVICAL SPINE WITHOUT CONTRAST TECHNIQUE: Multidetector CT imaging of the head and cervical spine was performed following the standard protocol without intravenous contrast. Multiplanar CT image  reconstructions of the cervical spine were also generated. COMPARISON:  None. FINDINGS: CT HEAD FINDINGS Brain: Ventricular system normal in size and appearance for age. No mass lesion. No midline shift. No acute hemorrhage or hematoma. No extra-axial fluid collections. No evidence of acute infarction. No focal brain parenchymal abnormalities. Incidental note of partial empty sella. Vascular: No visible atherosclerosis.  No hyperdense vessel. Skull: No skull fracture or other focal osseous abnormality involving the skull. Sinuses/Orbits: Visualized paranasal sinuses, bilateral mastoid air cells and  bilateral middle ear cavities well-aerated. Visualized orbits and globes normal in appearance. Other: None. CT CERVICAL SPINE FINDINGS Alignment: Anatomic POSTERIOR alignment. Straightening of the usual cervical lordosis. Skull base and vertebrae: No fractures identified involving the cervical spine. Facet joints anatomically aligned throughout with diffuse degenerative changes. Coronal reformatted images demonstrate an intact craniocervical junction, intact dens and intact lateral masses throughout. Soft tissues and spinal canal: No evidence of paraspinous or spinal canal hematoma. No evidence of spinal stenosis. Disc levels: Severe disc space narrowing at C5-6 and C6-7 with central disc protrusions at these levels. Mild disc space narrowing at C4-5 without visible protrusion. Predominantly facet hypertrophy accounts for multilevel foraminal stenoses including: Mild LEFT C3-4, moderate LEFT C4-5, moderate BILATERAL C5-6. Upper chest: Visualized lung apices clear. Visualized superior mediastinum normal. Other: None. IMPRESSION: CT Head: 1.  Normal examination. CT Cervical Spine: 1. No cervical spine fractures identified. 2. Degenerative disc disease and spondylosis at C5-6 and C6-7 with central disc protrusions at these levels. 3. Diffuse facet degenerative changes accounting for multilevel foraminal stenoses as detailed above. Electronically Signed   By: Hulan Saas M.D.   On: 06/29/2018 14:20    Procedures Procedures (including critical care time)  Medications Ordered in ED Medications  sodium chloride 0.9 % bolus 1,000 mL (0 mLs Intravenous Stopped 06/29/18 1444)  metoCLOPramide (REGLAN) injection 10 mg (10 mg Intravenous Given 06/29/18 1349)  diphenhydrAMINE (BENADRYL) injection 25 mg (25 mg Intravenous Given 06/29/18 1349)     Initial Impression / Assessment and Plan / ED Course  I have reviewed the triage vital signs and the nursing notes.  Pertinent labs & imaging results that were  available during my care of the patient were reviewed by me and considered in my medical decision making (see chart for details).     SECILIA APPS is a 62 y.o. female here with neck pain, chills, headaches. Afebrile, well appearing. No meningeal signs. Likely viral syndrome vs migraines. Will check CBC, CT head. Will give migraine cocktail  3:11 PM WBC nl. CT head unremarkable. CT neck showed degenerative disc disease. Felt better with migraine cocktail. Will dc home.    Final Clinical Impressions(s) / ED Diagnoses   Final diagnoses:  None    ED Discharge Orders    None       Charlynne Pander, MD 06/29/18 1511

## 2018-06-29 NOTE — Discharge Instructions (Signed)
Stay hydrated.   Take tylenol, motrin for headaches.   See your doctor  Return to ER if you have worse headaches, vomiting, weakness, fever, neck stiffness.

## 2018-07-13 ENCOUNTER — Other Ambulatory Visit: Payer: Self-pay | Admitting: Internal Medicine

## 2018-07-24 ENCOUNTER — Other Ambulatory Visit: Payer: Self-pay | Admitting: Internal Medicine

## 2018-07-25 NOTE — Telephone Encounter (Signed)
Klonopin refill

## 2018-09-27 ENCOUNTER — Encounter: Payer: Self-pay | Admitting: Internal Medicine

## 2018-09-27 ENCOUNTER — Ambulatory Visit: Payer: Self-pay | Admitting: Internal Medicine

## 2018-09-27 VITALS — BP 126/84 | HR 75 | Temp 98.0°F | Ht 62.0 in | Wt 194.4 lb

## 2018-09-27 DIAGNOSIS — F419 Anxiety disorder, unspecified: Secondary | ICD-10-CM

## 2018-09-27 DIAGNOSIS — N611 Abscess of the breast and nipple: Secondary | ICD-10-CM

## 2018-09-27 MED ORDER — CEPHALEXIN 500 MG PO CAPS: 500.0000 mg | ORAL_CAPSULE | Freq: Four times a day (QID) | ORAL | 0 refills | Status: AC

## 2018-09-27 MED ORDER — CLONAZEPAM 1 MG PO TABS: ORAL_TABLET | ORAL | 1 refills | Status: DC

## 2018-09-27 NOTE — Patient Instructions (Signed)

## 2018-10-01 DIAGNOSIS — N611 Abscess of the breast and nipple: Secondary | ICD-10-CM | POA: Insufficient documentation

## 2018-10-01 DIAGNOSIS — F419 Anxiety disorder, unspecified: Secondary | ICD-10-CM | POA: Insufficient documentation

## 2018-10-01 NOTE — Progress Notes (Signed)
  Subjective:     Patient ID: Anne BasemanMonica S Kreiser , female    DOB: December 25, 1955 , 62 y.o.   MRN: [id number]   Chief Complaint  Patient presents with  . Lump in left breast    HPI  She is here today for further evaluation of a lump in her left breast. She first noticed this several weeks ago. She reports having mammogram earlier this year. She denies nipple discharge.     Past Medical History:  Diagnosis Date  . Abnormal EKG 01/14/2018  . Depression   . Diabetes mellitus type 2 in obese (HCC) 01/14/2018  . Pure hypercholesterolemia 01/14/2018     Family History  Problem Relation Age of Onset  . Colon cancer Mother   . Endometrial cancer Mother   . Hypertension Mother   . Alzheimer's disease Father   . Multiple sclerosis Son   . Hypertension Son   . Depression Son   . Anxiety disorder Son   . Breast cancer Neg Hx      Current Outpatient Medications:  .  clonazePAM (KLONOPIN) 1 MG tablet, TAKE 1/2 TABLET BY MOUTH TWICE A DAY AS NEEDED, Disp: 30 tablet, Rfl: 1 .  escitalopram (LEXAPRO) 10 MG tablet, TAKE 1 TABLET BY MOUTH  EVERY DAY, Disp: 90 tablet, Rfl: 0 .  hydrochlorothiazide (MICROZIDE) 12.5 MG capsule, Take 1 capsule (12.5 mg total) by mouth daily., Disp: 30 capsule, Rfl: 3 .  cephALEXin (KEFLEX) 500 MG capsule, Take 1 capsule (500 mg total) by mouth 4 (four) times daily for 10 days., Disp: 40 capsule, Rfl: 0   No Known Allergies   Review of Systems  Constitutional: Negative.   Respiratory: Negative.   Cardiovascular: Negative.   Gastrointestinal: Negative.   Neurological: Negative.   Psychiatric/Behavioral: Negative.      Today's Vitals   09/27/18 1410  BP: 126/84  Pulse: 75  Temp: 98 F (36.7 C)  TempSrc: Oral  Weight: 194 lb 6.4 oz (88.2 kg)  Height: 5\' 2"  (1.575 m)   Body mass index is 35.56 kg/m.   Objective:  Physical Exam Vitals signs and nursing note reviewed.  Constitutional:      Appearance: Normal appearance. She is obese.  Cardiovascular:   Rate and Rhythm: Normal rate and regular rhythm.     Heart sounds: Normal heart sounds.  Pulmonary:     Effort: Pulmonary effort is normal.     Breath sounds: Normal breath sounds.  Chest:     Breasts:        Right: Normal. No swelling, bleeding, inverted nipple, mass or nipple discharge.        Left: Mass (there is 1cm raised, erythematous lesion underneath l breast, about 6oclock position. slightly tender to palpation, warm to touch) and skin change present.  Neurological:     General: No focal deficit present.     Mental Status: She is alert.  Psychiatric:        Mood and Affect: Mood normal.         Assessment And Plan:     1. Abscess of left breast  She was given rx Keflex qid. She is encouraged to complete full course of abx. She will rto in two weeks for re-evaluation.   2. Anxiety  She was given refill of clonazepam.   Gwynneth Alimentobyn N Liese Dizdarevic, MD

## 2018-10-06 ENCOUNTER — Other Ambulatory Visit: Payer: Self-pay | Admitting: Internal Medicine

## 2018-10-15 ENCOUNTER — Ambulatory Visit (INDEPENDENT_AMBULATORY_CARE_PROVIDER_SITE_OTHER): Payer: 59 | Admitting: Internal Medicine

## 2018-10-15 ENCOUNTER — Encounter: Payer: Self-pay | Admitting: Internal Medicine

## 2018-10-15 VITALS — BP 132/84 | HR 73 | Temp 98.4°F | Ht 62.5 in | Wt 195.4 lb

## 2018-10-15 DIAGNOSIS — Z6835 Body mass index (BMI) 35.0-35.9, adult: Secondary | ICD-10-CM

## 2018-10-15 DIAGNOSIS — I10 Essential (primary) hypertension: Secondary | ICD-10-CM | POA: Diagnosis not present

## 2018-10-15 DIAGNOSIS — E1165 Type 2 diabetes mellitus with hyperglycemia: Secondary | ICD-10-CM | POA: Diagnosis not present

## 2018-10-15 NOTE — Patient Instructions (Signed)
Diabetes Mellitus and Exercise Exercising regularly is important for your overall health, especially when you have diabetes (diabetes mellitus). Exercising is not only about losing weight. It has many other health benefits, such as increasing muscle strength and bone density and reducing body fat and stress. This leads to improved fitness, flexibility, and endurance, all of which result in better overall health. Exercise has additional benefits for people with diabetes, including:  Reducing appetite.  Helping to lower and control blood glucose.  Lowering blood pressure.  Helping to control amounts of fatty substances (lipids) in the blood, such as cholesterol and triglycerides.  Helping the body to respond better to insulin (improving insulin sensitivity).  Reducing how much insulin the body needs.  Decreasing the risk for heart disease by: ? Lowering cholesterol and triglyceride levels. ? Increasing the levels of good cholesterol. ? Lowering blood glucose levels. What is my activity plan? Your health care provider or certified diabetes educator can help you make a plan for the type and frequency of exercise (activity plan) that works for you. Make sure that you:  Do at least 150 minutes of moderate-intensity or vigorous-intensity exercise each week. This could be brisk walking, biking, or water aerobics. ? Do stretching and strength exercises, such as yoga or weightlifting, at least 2 times a week. ? Spread out your activity over at least 3 days of the week.  Get some form of physical activity every day. ? Do not go more than 2 days in a row without some kind of physical activity. ? Avoid being inactive for more than 30 minutes at a time. Take frequent breaks to walk or stretch.  Choose a type of exercise or activity that you enjoy, and set realistic goals.  Start slowly, and gradually increase the intensity of your exercise over time. What do I need to know about managing my  diabetes?   Check your blood glucose before and after exercising. ? If your blood glucose is 240 mg/dL (13.3 mmol/L) or higher before you exercise, check your urine for ketones. If you have ketones in your urine, do not exercise until your blood glucose returns to normal. ? If your blood glucose is 100 mg/dL (5.6 mmol/L) or lower, eat a snack containing 15-20 grams of carbohydrate. Check your blood glucose 15 minutes after the snack to make sure that your level is above 100 mg/dL (5.6 mmol/L) before you start your exercise.  Know the symptoms of low blood glucose (hypoglycemia) and how to treat it. Your risk for hypoglycemia increases during and after exercise. Common symptoms of hypoglycemia can include: ? Hunger. ? Anxiety. ? Sweating and feeling clammy. ? Confusion. ? Dizziness or feeling light-headed. ? Increased heart rate or palpitations. ? Blurry vision. ? Tingling or numbness around the mouth, lips, or tongue. ? Tremors or shakes. ? Irritability.  Keep a rapid-acting carbohydrate snack available before, during, and after exercise to help prevent or treat hypoglycemia.  Avoid injecting insulin into areas of the body that are going to be exercised. For example, avoid injecting insulin into: ? The arms, when playing tennis. ? The legs, when jogging.  Keep records of your exercise habits. Doing this can help you and your health care provider adjust your diabetes management plan as needed. Write down: ? Food that you eat before and after you exercise. ? Blood glucose levels before and after you exercise. ? The type and amount of exercise you have done. ? When your insulin is expected to peak, if you use   insulin. Avoid exercising at times when your insulin is peaking.  When you start a new exercise or activity, work with your health care provider to make sure the activity is safe for you, and to adjust your insulin, medicines, or food intake as needed.  Drink plenty of water while  you exercise to prevent dehydration or heat stroke. Drink enough fluid to keep your urine clear or pale yellow. Summary  Exercising regularly is important for your overall health, especially when you have diabetes (diabetes mellitus).  Exercising has many health benefits, such as increasing muscle strength and bone density and reducing body fat and stress.  Your health care provider or certified diabetes educator can help you make a plan for the type and frequency of exercise (activity plan) that works for you.  When you start a new exercise or activity, work with your health care provider to make sure the activity is safe for you, and to adjust your insulin, medicines, or food intake as needed. This information is not intended to replace advice given to you by your health care provider. Make sure you discuss any questions you have with your health care provider. Document Released: 12/12/2003 Document Revised: 04/01/2017 Document Reviewed: 03/02/2016 Elsevier Interactive Patient Education  2019 Elsevier Inc.  

## 2018-10-17 LAB — CMP14+EGFR
ALK PHOS: 106 IU/L (ref 39–117)
ALT: 33 IU/L — ABNORMAL HIGH (ref 0–32)
AST: 28 IU/L (ref 0–40)
Albumin/Globulin Ratio: 1.5 (ref 1.2–2.2)
Albumin: 4.3 g/dL (ref 3.6–4.8)
BUN/Creatinine Ratio: 18 (ref 12–28)
BUN: 13 mg/dL (ref 8–27)
Bilirubin Total: 0.4 mg/dL (ref 0.0–1.2)
CO2: 20 mmol/L (ref 20–29)
CREATININE: 0.71 mg/dL (ref 0.57–1.00)
Calcium: 9.4 mg/dL (ref 8.7–10.3)
Chloride: 102 mmol/L (ref 96–106)
GFR calc Af Amer: 104 mL/min/{1.73_m2} (ref >59)
GFR calc non Af Amer: 90 mL/min/{1.73_m2} (ref >59)
GLOBULIN, TOTAL: 2.8 g/dL (ref 1.5–4.5)
Glucose: 125 mg/dL — ABNORMAL HIGH (ref 65–99)
Potassium: 4.2 mmol/L (ref 3.5–5.2)
Sodium: 140 mmol/L (ref 134–144)
Total Protein: 7.1 g/dL (ref 6.0–8.5)

## 2018-10-17 LAB — HEMOGLOBIN A1C
Est. average glucose Bld gHb Est-mCnc: 154 mg/dL
HEMOGLOBIN A1C: 7 % — AB (ref 4.8–5.6)

## 2018-10-17 LAB — TSH: TSH: 1.81 u[IU]/mL (ref 0.450–4.500)

## 2018-10-17 LAB — LIPID PANEL
Chol/HDL Ratio: 4.7 ratio — ABNORMAL HIGH (ref 0.0–4.4)
Cholesterol, Total: 229 mg/dL — ABNORMAL HIGH (ref 100–199)
HDL: 49 mg/dL (ref >39)
LDL Calculated: 150 mg/dL — ABNORMAL HIGH (ref 0–99)
Triglycerides: 151 mg/dL — ABNORMAL HIGH (ref 0–149)
VLDL CHOLESTEROL CAL: 30 mg/dL (ref 5–40)

## 2018-10-17 LAB — HIV ANTIBODY (ROUTINE TESTING W REFLEX): HIV Screen 4th Generation wRfx: NONREACTIVE

## 2018-10-17 LAB — HEPATITIS C ANTIBODY: Hep C Virus Ab: <0.1 s/co ratio (ref 0.0–0.9)

## 2018-10-17 NOTE — Progress Notes (Signed)
Your liver enzymes have improved. ALT is down to 33 from 45. Your kidney fxn is nl . Your LDL, bad chol is 150. Ideally this should be less than 70 as a diabetic. Are you willing to take chol medication? Your thyroid fxn is nl. You are neg for HIV. You are also neg for hepatitis c virus.

## 2018-10-18 ENCOUNTER — Encounter: Payer: Self-pay | Admitting: Internal Medicine

## 2018-10-18 NOTE — Progress Notes (Signed)
Subjective:     Patient ID: Anne Woodard , female    DOB: [birth year] , 63 y.o.   MRN: [id number]   Chief Complaint  Patient presents with  . Diabetes  . Hypertension    HPI  Diabetes  She presents for her follow-up diabetic visit. She has type 2 diabetes mellitus. Her disease course has been stable. There are no hypoglycemic associated symptoms. Pertinent negatives for diabetes include no blurred vision and no chest pain. There are no hypoglycemic complications. Symptoms are worsening. Risk factors for coronary artery disease include diabetes mellitus, hypertension, obesity, sedentary lifestyle and post-menopausal.  Hypertension  This is a chronic problem. The current episode started more than 1 year ago. The problem has been gradually improving since onset. The problem is controlled. Pertinent negatives include no blurred vision, chest pain, palpitations or shortness of breath.  She admits that she was not compliant with meds while caring for her ill mother. Unfortunately, her Mom has passed since her last dm appt.    Past Medical History:  Diagnosis Date  . Abnormal EKG 01/14/2018  . Depression   . Diabetes mellitus type 2 in obese ([place]) 01/14/2018  . Pure hypercholesterolemia 01/14/2018     Family History  Problem Relation Age of Onset  . Colon cancer Mother   . Endometrial cancer Mother   . Hypertension Mother   . Alzheimer's disease Father   . Multiple sclerosis Son   . Hypertension Son   . Depression Son   . Anxiety disorder Son   . Breast cancer Neg Hx      Current Outpatient Medications:  .  clonazePAM (KLONOPIN) 1 MG tablet, TAKE 1/2 TABLET BY MOUTH TWICE A DAY AS NEEDED, Disp: 30 tablet, Rfl: 1 .  escitalopram (LEXAPRO) 10 MG tablet, TAKE 1 TABLET BY MOUTH  EVERY DAY, Disp: 90 tablet, Rfl: 0 .  losartan (COZAAR) 25 MG tablet, Take 25 mg by mouth daily., Disp: , Rfl:    No Known Allergies   Review of Systems  Constitutional: Negative.   Eyes: Negative for  blurred vision.  Respiratory: Negative.  Negative for shortness of breath.   Cardiovascular: Negative.  Negative for chest pain and palpitations.  Gastrointestinal: Negative.   Neurological: Negative.   Psychiatric/Behavioral: Negative.      Today's Vitals   10/15/18 1058  BP: 132/84  Pulse: 73  Temp: 98.4 F (36.9 C)  TempSrc: Oral  Weight: 195 lb 6.4 oz (88.6 kg)  Height: 5' 2.5" (1.588 m)   Body mass index is 35.17 kg/m.   Objective:  Physical Exam Vitals signs and nursing note reviewed.  Constitutional:      Appearance: Normal appearance. She is obese.  HENT:     Head: Normocephalic and atraumatic.  Cardiovascular:     Rate and Rhythm: Normal rate and regular rhythm.     Heart sounds: Normal heart sounds.  Pulmonary:     Effort: Pulmonary effort is normal.     Breath sounds: Normal breath sounds.  Skin:    General: Skin is warm.  Neurological:     General: No focal deficit present.     Mental Status: She is alert.  Psychiatric:        Mood and Affect: Mood normal.         Assessment And Plan:     1. Uncontrolled type 2 diabetes mellitus with hyperglycemia ([place])  I will check labs as listed below. She has not tolerated Glp-agonists in the past due to  GI disturbance. Perhaps we could try SGLT-2 inhibitor. She has been intolerant of metformin as well. I will make further recommendations once her labs are available for review.   - CMP14+EGFR - Hemoglobin A1c - Lipid Profile  2. Essential hypertension, benign  Fair control. She will continue with current meds. She is encouraged to avoid adding salt to her foods.   - TSH - HIV antibody (with reflex) - Hepatitis C antibody  3. Class 2 severe obesity due to excess calories with serious comorbidity and body mass index (BMI) of 35.0 to 35.9 in adult [facility])  She is encouraged to strive for BMI less than 30 to decrease cardiac risk. She is encouraged to exercise 30 minutes five days weekly.   [name],  MD

## 2018-10-21 ENCOUNTER — Other Ambulatory Visit: Payer: Self-pay

## 2018-10-21 MED ORDER — LOSARTAN POTASSIUM 25 MG PO TABS: 25.0000 mg | ORAL_TABLET | Freq: Every day | ORAL | 4 refills | Status: DC

## 2018-10-26 ENCOUNTER — Other Ambulatory Visit: Payer: Self-pay

## 2018-10-26 MED ORDER — ROSUVASTATIN CALCIUM 10 MG PO TABS: 10.0000 mg | ORAL_TABLET | Freq: Every day | ORAL | 1 refills | Status: DC

## 2018-12-10 ENCOUNTER — Encounter: Payer: Self-pay | Admitting: Internal Medicine

## 2018-12-10 ENCOUNTER — Ambulatory Visit (INDEPENDENT_AMBULATORY_CARE_PROVIDER_SITE_OTHER): Payer: 59 | Admitting: Internal Medicine

## 2018-12-10 VITALS — BP 114/80 | HR 73 | Temp 98.2°F | Ht 62.5 in | Wt 194.6 lb

## 2018-12-10 DIAGNOSIS — E7800 Pure hypercholesterolemia, unspecified: Secondary | ICD-10-CM

## 2018-12-10 DIAGNOSIS — Z6835 Body mass index (BMI) 35.0-35.9, adult: Secondary | ICD-10-CM

## 2018-12-10 DIAGNOSIS — I10 Essential (primary) hypertension: Secondary | ICD-10-CM | POA: Diagnosis not present

## 2018-12-10 DIAGNOSIS — E1169 Type 2 diabetes mellitus with other specified complication: Secondary | ICD-10-CM

## 2018-12-10 DIAGNOSIS — E1159 Type 2 diabetes mellitus with other circulatory complications: Secondary | ICD-10-CM

## 2018-12-10 DIAGNOSIS — E669 Obesity, unspecified: Secondary | ICD-10-CM

## 2018-12-10 NOTE — Progress Notes (Signed)
Subjective:     Patient ID: Anne Woodard , female    DOB: [birth year] , 63 y.o.   MRN: [id number]   Chief Complaint  Patient presents with  . Trulicty f/u  . Hyperlipidemia    HPI  She is here today for f/u Trulicity. She reports she tolerated this better than Ozempic. She ran out two weeks ago.   Additionally, she was started on rosuvastatin at her last visit. She has not had any issues with the medications. She denies muscle cramps.     Past Medical History:  Diagnosis Date  . Abnormal EKG 01/14/2018  . Depression   . Diabetes mellitus type 2 in obese (HCC) 01/14/2018  . Pure hypercholesterolemia 01/14/2018     Family History  Problem Relation Age of Onset  . Colon cancer Mother   . Endometrial cancer Mother   . Hypertension Mother   . Alzheimer's disease Father   . Multiple sclerosis Son   . Hypertension Son   . Depression Son   . Anxiety disorder Son   . Breast cancer Neg Hx      Current Outpatient Medications:  .  clonazePAM (KLONOPIN) 1 MG tablet, TAKE 1/2 TABLET BY MOUTH TWICE A DAY AS NEEDED, Disp: 30 tablet, Rfl: 1 .  Dulaglutide (TRULICITY) 0.75 MG/0.5ML SOPN, Inject into the skin., Disp: , Rfl:  .  escitalopram (LEXAPRO) 10 MG tablet, TAKE 1 TABLET BY MOUTH  EVERY DAY, Disp: 90 tablet, Rfl: 0 .  losartan (COZAAR) 25 MG tablet, Take 1 tablet (25 mg total) by mouth daily., Disp: 30 tablet, Rfl: 4 .  rosuvastatin (CRESTOR) 10 MG tablet, Take 1 tablet (10 mg total) by mouth daily., Disp: 30 tablet, Rfl: 1   No Known Allergies   Review of Systems  Constitutional: Negative.   Respiratory: Negative.   Cardiovascular: Negative.   Gastrointestinal: Negative.   Neurological: Negative.   Psychiatric/Behavioral: Negative.      Today's Vitals   12/10/18 1024  BP: 114/80  Pulse: 73  Temp: 98.2 F (36.8 C)  TempSrc: Oral  Weight: 194 lb 9.6 oz (88.3 kg)  Height: 5' 2.5" (1.588 m)   Body mass index is 35.03 kg/m.   Objective:  Physical Exam Vitals  signs and nursing note reviewed.  Constitutional:      Appearance: Normal appearance.  HENT:     Head: Normocephalic and atraumatic.  Cardiovascular:     Rate and Rhythm: Normal rate and regular rhythm.     Heart sounds: Normal heart sounds.  Pulmonary:     Effort: Pulmonary effort is normal.     Breath sounds: Normal breath sounds.  Skin:    General: Skin is warm.  Neurological:     General: No focal deficit present.     Mental Status: She is alert.  Psychiatric:        Mood and Affect: Mood normal.        Behavior: Behavior normal.         Assessment And Plan:     1. Obesity, diabetes, and hypertension syndrome (HCC)  She feels well on trulicity. Unfortunately, she ran out about two weeks ago. I will resume 0.75mg  once weekly x 2 weeks, then 1.5mg  once weekly.   Well controlled. She will continue with current meds.   Importance of regular exercise was discussed with the patient.   2. Pure hypercholesterolemia  She was started on rosuvastatin 10mg  daily after her last visit. I will check labs as listed below.   -  Lipid Profile - Liver Profile  3. Class 2 severe obesity due to excess calories with serious comorbidity and body mass index (BMI) of 35.0 to 35.9 in adult [facility])  Importance of achieving optimal weight to decrease risk of cardiovascular disease and cancers was discussed with the patient in full detail. She is encouraged to start slowly - start with 10 minutes twice daily at least three to four days per week and to gradually build to 30 minutes five days weekly. She was given tips to incorporate more activity into her daily routine - take stairs when possible, park farther away from her job, grocery stores, etc. .          Gwynneth Aliment, MD

## 2018-12-10 NOTE — Patient Instructions (Signed)

## 2018-12-11 ENCOUNTER — Encounter: Payer: Self-pay | Admitting: Internal Medicine

## 2018-12-11 LAB — LIPID PANEL
Chol/HDL Ratio: 3.2 ratio (ref 0.0–4.4)
Cholesterol, Total: 141 mg/dL (ref 100–199)
HDL: 44 mg/dL (ref >39)
LDL Calculated: 71 mg/dL (ref 0–99)
Triglycerides: 132 mg/dL (ref 0–149)
VLDL CHOLESTEROL CAL: 26 mg/dL (ref 5–40)

## 2018-12-11 LAB — HEPATIC FUNCTION PANEL
ALBUMIN: 4.2 g/dL (ref 3.8–4.8)
ALT: 28 IU/L (ref 0–32)
AST: 23 IU/L (ref 0–40)
Alkaline Phosphatase: 99 IU/L (ref 39–117)
Bilirubin Total: 0.3 mg/dL (ref 0.0–1.2)
Bilirubin, Direct: 0.1 mg/dL (ref 0.00–0.40)
TOTAL PROTEIN: 7 g/dL (ref 6.0–8.5)

## 2018-12-15 ENCOUNTER — Other Ambulatory Visit: Payer: Self-pay | Admitting: Internal Medicine

## 2018-12-15 MED ORDER — CLONAZEPAM 1 MG PO TABS: ORAL_TABLET | ORAL | 1 refills | Status: DC

## 2018-12-16 ENCOUNTER — Other Ambulatory Visit: Payer: Self-pay

## 2018-12-16 MED ORDER — ROSUVASTATIN CALCIUM 10 MG PO TABS: 10.0000 mg | ORAL_TABLET | Freq: Every day | ORAL | 1 refills | Status: DC

## 2018-12-18 ENCOUNTER — Other Ambulatory Visit: Payer: Self-pay | Admitting: Internal Medicine

## 2019-02-04 ENCOUNTER — Encounter: Payer: Self-pay | Admitting: Internal Medicine

## 2019-03-11 ENCOUNTER — Encounter: Payer: Self-pay | Admitting: Internal Medicine

## 2019-04-12 ENCOUNTER — Encounter: Payer: Self-pay | Admitting: Internal Medicine

## 2019-04-14 ENCOUNTER — Telehealth: Payer: Self-pay

## 2019-04-14 NOTE — Telephone Encounter (Signed)
Called pt to schedule missed physical app, pt stated she will call back to schedule appt she needs to have her calender with her 04/14/2019

## 2019-05-10 ENCOUNTER — Other Ambulatory Visit: Payer: Self-pay | Admitting: Internal Medicine

## 2019-05-10 ENCOUNTER — Other Ambulatory Visit: Payer: Self-pay

## 2019-05-10 MED ORDER — ESCITALOPRAM OXALATE 10 MG PO TABS: 10.0000 mg | ORAL_TABLET | Freq: Every day | ORAL | 0 refills | Status: DC

## 2019-05-10 NOTE — Telephone Encounter (Signed)
Clonazepam refill 

## 2019-06-27 ENCOUNTER — Ambulatory Visit (INDEPENDENT_AMBULATORY_CARE_PROVIDER_SITE_OTHER): Payer: 59 | Admitting: Internal Medicine

## 2019-06-27 ENCOUNTER — Encounter: Payer: Self-pay | Admitting: Internal Medicine

## 2019-06-27 ENCOUNTER — Other Ambulatory Visit: Payer: Self-pay

## 2019-06-27 VITALS — BP 138/80 | HR 84 | Temp 98.3°F | Ht 61.4 in | Wt 196.2 lb

## 2019-06-27 DIAGNOSIS — Z0000 Encounter for general adult medical examination without abnormal findings: Secondary | ICD-10-CM | POA: Diagnosis not present

## 2019-06-27 DIAGNOSIS — E1169 Type 2 diabetes mellitus with other specified complication: Secondary | ICD-10-CM

## 2019-06-27 DIAGNOSIS — Z23 Encounter for immunization: Secondary | ICD-10-CM | POA: Diagnosis not present

## 2019-06-27 DIAGNOSIS — E1159 Type 2 diabetes mellitus with other circulatory complications: Secondary | ICD-10-CM

## 2019-06-27 DIAGNOSIS — I10 Essential (primary) hypertension: Secondary | ICD-10-CM

## 2019-06-27 DIAGNOSIS — E2839 Other primary ovarian failure: Secondary | ICD-10-CM

## 2019-06-27 DIAGNOSIS — E559 Vitamin D deficiency, unspecified: Secondary | ICD-10-CM

## 2019-06-27 DIAGNOSIS — E669 Obesity, unspecified: Secondary | ICD-10-CM

## 2019-06-27 LAB — POCT URINALYSIS DIPSTICK
Bilirubin, UA: NEGATIVE
Blood, UA: NEGATIVE
Glucose, UA: NEGATIVE
Ketones, UA: NEGATIVE
Nitrite, UA: POSITIVE
Protein, UA: NEGATIVE
Spec Grav, UA: 1.02 (ref 1.010–1.025)
Urobilinogen, UA: 0.2 E.U./dL
pH, UA: 5.5 (ref 5.0–8.0)

## 2019-06-27 LAB — POCT UA - MICROALBUMIN
Albumin/Creatinine Ratio, Urine, POC: <30
Creatinine, POC: 100 mg/dL
Microalbumin Ur, POC: 10 mg/L

## 2019-06-27 MED ORDER — LOSARTAN POTASSIUM 25 MG PO TABS: 25.0000 mg | ORAL_TABLET | Freq: Every day | ORAL | 2 refills | Status: DC

## 2019-06-27 MED ORDER — ROSUVASTATIN CALCIUM 10 MG PO TABS: 10.0000 mg | ORAL_TABLET | Freq: Every day | ORAL | 1 refills | Status: DC

## 2019-06-27 MED ORDER — CLONAZEPAM 1 MG PO TABS: ORAL_TABLET | ORAL | 1 refills | Status: DC

## 2019-06-27 MED ORDER — ESCITALOPRAM OXALATE 10 MG PO TABS: 10.0000 mg | ORAL_TABLET | Freq: Every day | ORAL | 1 refills | Status: DC

## 2019-06-27 NOTE — Patient Instructions (Addendum)
  Anne Woodard , Thank you for taking time to come for your Medicare Wellness Visit. I appreciate your ongoing commitment to your health goals. Please review the following plan we discussed and let me know if I can assist you in the future.   These are the goals we discussed: Goals    . Weight (lb) < 200 lb (90.7 kg)     She plans to walk more. She wants to lose 20 pounds.        This is a list of the screening recommended for you and due dates:  Health Maintenance  Topic Date Due  . Eye exam for diabetics  04/19/1964  . Colon Cancer Screening  04/19/2004  . Mammogram  03/24/2019  . Hemoglobin A1C  04/15/2019  . DEXA scan (bone density measurement)  04/20/2019  . Pneumonia vaccines (1 of 2 - PCV13) 06/06/2019  . Complete foot exam   06/26/2020  . Tetanus Vaccine  04/30/2027  . Flu Shot  Completed  .  Hepatitis C: One time screening is recommended by Center for Disease Control  (CDC) for  adults born from [birth year] through 1967.   Completed  . HIV Screening  Completed  . Pap Smear  Discontinued

## 2019-06-28 LAB — CMP14+EGFR
ALT: 40 IU/L — ABNORMAL HIGH (ref 0–32)
AST: 39 IU/L (ref 0–40)
Albumin/Globulin Ratio: 1.6 (ref 1.2–2.2)
Albumin: 4.5 g/dL (ref 3.8–4.8)
Alkaline Phosphatase: 120 IU/L — ABNORMAL HIGH (ref 39–117)
BUN/Creatinine Ratio: 14 (ref 12–28)
BUN: 8 mg/dL (ref 8–27)
Bilirubin Total: 0.4 mg/dL (ref 0.0–1.2)
CO2: 20 mmol/L (ref 20–29)
Calcium: 9.5 mg/dL (ref 8.7–10.3)
Chloride: 101 mmol/L (ref 96–106)
Creatinine, Ser: 0.59 mg/dL (ref 0.57–1.00)
GFR calc Af Amer: 111 mL/min/{1.73_m2} (ref >59)
GFR calc non Af Amer: 96 mL/min/{1.73_m2} (ref >59)
Globulin, Total: 2.8 g/dL (ref 1.5–4.5)
Glucose: 102 mg/dL — ABNORMAL HIGH (ref 65–99)
Potassium: 4.1 mmol/L (ref 3.5–5.2)
Sodium: 142 mmol/L (ref 134–144)
Total Protein: 7.3 g/dL (ref 6.0–8.5)

## 2019-06-28 LAB — LIPID PANEL
Chol/HDL Ratio: 3.6 ratio (ref 0.0–4.4)
Cholesterol, Total: 174 mg/dL (ref 100–199)
HDL: 48 mg/dL (ref >39)
LDL Chol Calc (NIH): 99 mg/dL (ref 0–99)
Triglycerides: 154 mg/dL — ABNORMAL HIGH (ref 0–149)
VLDL Cholesterol Cal: 27 mg/dL (ref 5–40)

## 2019-06-28 LAB — CBC
Hematocrit: 41.8 % (ref 34.0–46.6)
Hemoglobin: 14.3 g/dL (ref 11.1–15.9)
MCH: 31.1 pg (ref 26.6–33.0)
MCHC: 34.2 g/dL (ref 31.5–35.7)
MCV: 91 fL (ref 79–97)
Platelets: 279 10*3/uL (ref 150–450)
RBC: 4.6 x10E6/uL (ref 3.77–5.28)
RDW: 12 % (ref 11.7–15.4)
WBC: 8 10*3/uL (ref 3.4–10.8)

## 2019-06-28 LAB — VITAMIN D 25 HYDROXY (VIT D DEFICIENCY, FRACTURES): Vit D, 25-Hydroxy: 19.4 ng/mL — ABNORMAL LOW (ref 30.0–100.0)

## 2019-06-28 LAB — HEMOGLOBIN A1C
Est. average glucose Bld gHb Est-mCnc: 157 mg/dL
Hgb A1c MFr Bld: 7.1 % — ABNORMAL HIGH (ref 4.8–5.6)

## 2019-07-02 ENCOUNTER — Encounter: Payer: Self-pay | Admitting: Internal Medicine

## 2019-07-02 NOTE — Progress Notes (Signed)
Subjective:    Anne Woodard is a 63 y.o. female who presents for a Welcome to Medicare exam.   She is here today for a full physical examination. She does not wish to have a pelvic exam. She adds that she has retired since her last visit.   Diabetes She presents for her follow-up diabetic visit. She has type 2 diabetes mellitus. There are no hypoglycemic associated symptoms. There are no diabetic associated symptoms. Pertinent negatives for diabetes include no blurred vision and no chest pain. There are no hypoglycemic complications. Risk factors for coronary artery disease include diabetes mellitus, dyslipidemia, hypertension, obesity, post-menopausal and sedentary lifestyle. She is compliant with treatment most of the time. She is following a diabetic diet. She never participates in exercise. An ACE inhibitor/angiotensin II receptor blocker is being taken. Eye exam is not current.  Hypertension This is a chronic problem. The current episode started more than 1 year ago. The problem has been gradually improving since onset. The problem is controlled. Pertinent negatives include no blurred vision, chest pain, palpitations or shortness of breath. The current treatment provides moderate improvement. Compliance problems include exercise.       Review of Systems  Review of Systems  Constitutional: Negative.   HENT: Negative.   Eyes: Negative.  Negative for blurred vision.  Respiratory: Negative.  Negative for shortness of breath.   Cardiovascular: Negative.  Negative for chest pain and palpitations.  Gastrointestinal: Negative.   Genitourinary: Negative.   Musculoskeletal: Negative.   Skin: Negative.   Neurological: Negative.   Endo/Heme/Allergies: Negative.   Psychiatric/Behavioral: Negative.    Cardiac Risk Factors include: advanced age (>55mn, >>3women);diabetes mellitus;dyslipidemia;obesity (BMI >30kg/m2)      Objective:    Today's Vitals   06/27/19 1500  BP: 138/80   Pulse: 84  Temp: 98.3 F (36.8 C)  TempSrc: Oral  SpO2: 97%  Weight: 196 lb 3.2 oz (89 kg)  Height: 5' 1.4" (1.56 m)  Body mass index is 36.59 kg/m.  Medications Outpatient Encounter Medications as of 06/27/2019  Medication Sig  . clonazePAM (KLONOPIN) 1 MG tablet TAKE 1/2 TABLET BY MOUTH TWICE A DAY AS NEEDED  . escitalopram (LEXAPRO) 10 MG tablet Take 1 tablet (10 mg total) by mouth daily.  .Marland Kitchenlosartan (COZAAR) 25 MG tablet Take 1 tablet (25 mg total) by mouth daily.  . rosuvastatin (CRESTOR) 10 MG tablet Take 1 tablet (10 mg total) by mouth daily.  . [DISCONTINUED] clonazePAM (KLONOPIN) 1 MG tablet TAKE 1/2 TABLET BY MOUTH TWICE A DAY AS NEEDED  . [DISCONTINUED] escitalopram (LEXAPRO) 10 MG tablet Take 1 tablet (10 mg total) by mouth daily.  . [DISCONTINUED] losartan (COZAAR) 25 MG tablet TAKE 1 TABLET BY MOUTH EVERY DAY  . [DISCONTINUED] rosuvastatin (CRESTOR) 10 MG tablet Take 1 tablet (10 mg total) by mouth daily.  . [DISCONTINUED] Dulaglutide (TRULICITY) 06.21MHY/8.6VHSOPN Inject into the skin.   No facility-administered encounter medications on file as of 06/27/2019.      History: Past Medical History:  Diagnosis Date  . Abnormal EKG 01/14/2018  . Depression   . Diabetes mellitus type 2 in obese ([place] 01/14/2018  . Pure hypercholesterolemia 01/14/2018   History reviewed. No pertinent surgical history.  Family History  Problem Relation Age of Onset  . Colon cancer Mother   . Endometrial cancer Mother   . Hypertension Mother   . Alzheimer's disease Father   . Multiple sclerosis Son   . Hypertension Son   . Depression  Son   . Anxiety disorder Son   . Breast cancer Neg Hx    Social History   Occupational History  . Not on file  Tobacco Use  . Smoking status: Never Smoker  . Smokeless tobacco: Never Used  . Tobacco comment: Not applicable.   Substance and Sexual Activity  . Alcohol use: No  . Drug use: Not Currently  . Sexual activity: Not on file    Tobacco  Counseling Counseling given: No Comment: Not applicable.    Immunizations and Health Maintenance Immunization History  Administered Date(s) Administered  . Influenza, High Dose Seasonal PF 06/27/2019  . Influenza-Unspecified 06/19/2018  . Pneumococcal-Unspecified 06/05/2018   Health Maintenance Due  Topic Date Due  . OPHTHALMOLOGY EXAM  04/19/1964  . COLONOSCOPY  04/19/2004  . MAMMOGRAM  03/24/2019  . DEXA SCAN  04/20/2019  . PNA vac Low Risk Adult (1 of 2 - PCV13) 06/06/2019    Activities of Daily Living In your present state of health, do you have any difficulty performing the following activities: 06/27/2019  Hearing? N  Vision? N  Difficulty concentrating or making decisions? N  Walking or climbing stairs? N  Dressing or bathing? N  Doing errands, shopping? N  Preparing Food and eating ? N  Using the Toilet? N  In the past six months, have you accidently leaked urine? N  Do you have problems with loss of bowel control? N  Managing your Medications? N  Managing your Finances? N  Housekeeping or managing your Housekeeping? N  Some recent data might be hidden    Physical Exam    Physical Exam  Constitutional: She is oriented to person, place, and time and well-developed, well-nourished, and in no distress.  HENT:  Head: Normocephalic and atraumatic.  Right Ear: External ear normal.  Left Ear: External ear normal.  Mouth/Throat: Oropharynx is clear and moist.  Eyes: Pupils are equal, round, and reactive to light. Conjunctivae and EOM are normal.  Neck: Normal range of motion. Neck supple.  Cardiovascular: Normal rate, regular rhythm, normal heart sounds and intact distal pulses.  Pulses:      Dorsalis pedis pulses are 2+ on the right side and 2+ on the left side.       Posterior tibial pulses are 1+ on the right side and 1+ on the left side.  Pulmonary/Chest: Effort normal and breath sounds normal.  Abdominal: Soft. Bowel sounds are normal.  Genitourinary:     Genitourinary Comments: deferred   Musculoskeletal: Normal range of motion.     Comments: Foot exam performed, five areas tested, all five were sensed. NO callus noted.   Neurological: She is alert and oriented to person, place, and time. She has normal reflexes. GCS score is 15.  Skin: Skin is warm and dry.  Psychiatric: Memory, affect and judgment normal.  Nursing note and vitals reviewed.  Foot/Ankle Musculoskeletal Exam  General    Neurological: alert    General additional comments: Foot exam performed, five areas tested, all five were sensed. NO callus noted.   Neurovascular    Neurovascular - Right      Dorsalis pedis: 2+     Posterior tibial: 1+     Capillary refill: brisk    Neurovascular - Left      Dorsalis pedis: 2+     Posterior tibial: 1+     Capillary refill: brisk   Advanced Directives: Does Patient Have a Medical Advance Directive?: No    Assessment:    This  is a routine wellness examination for this patient .   Vision/Hearing screen  Hearing Screening   125Hz  250Hz  500Hz  1000Hz  2000Hz  3000Hz  4000Hz  6000Hz  8000Hz   Right ear:   Fail Pass Pass  Pass    Left ear:   Pass Pass Pass  Pass      Visual Acuity Screening   Right eye Left eye Both eyes  Without correction:     With correction: 20/30 20/30 20/30     Dietary issues and exercise activities discussed:  Current Exercise Habits: The patient has a physically strenuous job, but has no regular exercise apart from work.  Goals    . Weight (lb) < 200 lb (90.7 kg)     She plans to walk more. She wants to lose 20 pounds.       Depression Screen PHQ 2/9 Scores 06/27/2019 10/15/2018 09/27/2018  PHQ - 2 Score 0 2 0  PHQ- 9 Score - 2 -     Fall Risk Fall Risk  06/27/2019  Falls in the past year? 0    Cognitive Function:     6CIT Screen 06/27/2019 06/27/2019  What Year? 0 points 0 points  What month? 0 points 0 points  What time? 0 points 0 points  Count back from 20 0 points 0 points   Months in reverse 0 points 0 points  Repeat phrase 0 points 0 points  Total Score 0 0    Patient Care Team: Glendale Chard, MD as PCP - General (Internal Medicine)     Plan:   1. Welcome to Medicare preventive visit  A full exam was performed.  Importance of monthly self breast exams was discussed with the patient. Welcome to Medicare exam was performed along with functional assessment, discussion of Advanced directives and assessment of memory.  PATIENT HAS BEEN ADVISED TO GET 30-45 MINUTES REGULAR EXERCISE NO LESS THAN FOUR TO FIVE DAYS PER WEEK - BOTH WEIGHTBEARING EXERCISES AND AEROBIC ARE RECOMMENDED.  SHE WAS ADVISED TO FOLLOW A HEALTHY DIET WITH AT LEAST SIX FRUITS/VEGGIES PER DAY, DECREASE INTAKE OF RED MEAT, AND TO INCREASE FISH INTAKE TO TWO DAYS PER WEEK.  MEATS/FISH SHOULD NOT BE FRIED, BAKED OR BROILED IS PREFERABLE.  I SUGGEST WEARING SPF 50 SUNSCREEN ON EXPOSED PARTS AND ESPECIALLY WHEN IN THE DIRECT SUNLIGHT FOR AN EXTENDED PERIOD OF TIME.  PLEASE AVOID FAST FOOD RESTAURANTS AND INCREASE YOUR WATER INTAKE.  - POCT Urinalysis Dipstick (81002) - POCT UA - Microalbumin - EKG 12-Lead  2. Obesity, diabetes, and hypertension syndrome ([place])  Diabetic foot exam was performed. I DISCUSSED WITH THE PATIENT AT LENGTH REGARDING THE GOALS OF GLYCEMIC CONTROL AND POSSIBLE LONG-TERM COMPLICATIONS.  I  ALSO STRESSED THE IMPORTANCE OF COMPLIANCE WITH HOME GLUCOSE MONITORING, DIETARY RESTRICTIONS INCLUDING AVOIDANCE OF SUGARY DRINKS/PROCESSED FOODS,  ALONG WITH REGULAR EXERCISE.  I  ALSO STRESSED THE IMPORTANCE OF ANNUAL EYE EXAMS, SELF FOOT CARE AND COMPLIANCE WITH OFFICE VISITS. Blood pressure is fairly controlled. Pt is aware that optimal bp is less than 120/80. She is encouraged to avoid adding salt to her foods. Also encouraged to incorporate more exercise into her daily routine. EKG performed, no new changes noted.  Her BMI is 30 - she is encouraged to strive for BMI less than 30 to  decrease cardiac risk.  She is encouraged to exercise 150 minutes per week.   - POCT Urinalysis Dipstick (81002) - POCT UA - Microalbumin - EKG 12-Lead - CMP14+EGFR - CBC - Lipid panel - Hemoglobin A1c  3.  Estrogen deficiency  I will check baseline bone density.   4. Need for influenza vaccination  - Flu vaccine HIGH DOSE PF (Fluzone High dose)  5. Vitamin D deficiency disease  I WILL CHECK A VIT D LEVEL AND SUPPLEMENT AS NEEDED.  ALSO ENCOURAGED TO SPEND 15 MINUTES IN THE SUN DAILY.  - Vitamin D (25 hydroxy)      I have personally reviewed and noted the following in the patient's chart:   . Medical and social history . Use of alcohol, tobacco or illicit drugs  . Current medications and supplements . Functional ability and status . Nutritional status . Physical activity . Advanced directives . List of other physicians . Hospitalizations, surgeries, and ER visits in previous 12 months . Vitals . Screenings to include cognitive, depression, and falls . Referrals and appointments  In addition, I have reviewed and discussed with patient certain preventive protocols, quality metrics, and best practice recommendations. A written personalized care plan for preventive services as well as general preventive health recommendations were provided to patient.     [name], MD 07/02/2019

## 2019-10-30 ENCOUNTER — Ambulatory Visit: Payer: Self-pay | Admitting: Internal Medicine

## 2019-12-07 ENCOUNTER — Other Ambulatory Visit: Payer: Self-pay

## 2019-12-07 ENCOUNTER — Encounter: Payer: Self-pay | Admitting: Internal Medicine

## 2019-12-07 ENCOUNTER — Ambulatory Visit (INDEPENDENT_AMBULATORY_CARE_PROVIDER_SITE_OTHER): Payer: 59 | Admitting: Internal Medicine

## 2019-12-07 VITALS — BP 124/80 | HR 89 | Temp 98.8°F | Ht 62.4 in | Wt 195.0 lb

## 2019-12-07 DIAGNOSIS — E1169 Type 2 diabetes mellitus with other specified complication: Secondary | ICD-10-CM | POA: Diagnosis not present

## 2019-12-07 DIAGNOSIS — E1159 Type 2 diabetes mellitus with other circulatory complications: Secondary | ICD-10-CM | POA: Diagnosis not present

## 2019-12-07 DIAGNOSIS — I10 Essential (primary) hypertension: Secondary | ICD-10-CM | POA: Diagnosis not present

## 2019-12-07 DIAGNOSIS — Z1231 Encounter for screening mammogram for malignant neoplasm of breast: Secondary | ICD-10-CM

## 2019-12-07 DIAGNOSIS — E119 Type 2 diabetes mellitus without complications: Secondary | ICD-10-CM

## 2019-12-07 DIAGNOSIS — I152 Hypertension secondary to endocrine disorders: Secondary | ICD-10-CM

## 2019-12-07 DIAGNOSIS — E66812 Obesity, class 2: Secondary | ICD-10-CM

## 2019-12-07 DIAGNOSIS — E669 Obesity, unspecified: Secondary | ICD-10-CM

## 2019-12-07 DIAGNOSIS — Z6835 Body mass index (BMI) 35.0-35.9, adult: Secondary | ICD-10-CM

## 2019-12-07 DIAGNOSIS — Z1211 Encounter for screening for malignant neoplasm of colon: Secondary | ICD-10-CM

## 2019-12-07 DIAGNOSIS — E2839 Other primary ovarian failure: Secondary | ICD-10-CM

## 2019-12-07 MED ORDER — PRAVASTATIN SODIUM 80 MG PO TABS: ORAL_TABLET | ORAL | 2 refills | Status: DC

## 2019-12-07 MED ORDER — LOSARTAN POTASSIUM 25 MG PO TABS: 25.0000 mg | ORAL_TABLET | Freq: Every day | ORAL | 2 refills | Status: DC

## 2019-12-07 MED ORDER — ESCITALOPRAM OXALATE 10 MG PO TABS: 10.0000 mg | ORAL_TABLET | Freq: Every day | ORAL | 2 refills | Status: DC

## 2019-12-07 NOTE — Patient Instructions (Signed)

## 2019-12-08 LAB — BMP8+EGFR
BUN/Creatinine Ratio: 21 (ref 12–28)
BUN: 14 mg/dL (ref 8–27)
CO2: 22 mmol/L (ref 20–29)
Calcium: 9.9 mg/dL (ref 8.7–10.3)
Chloride: 103 mmol/L (ref 96–106)
Creatinine, Ser: 0.66 mg/dL (ref 0.57–1.00)
GFR calc Af Amer: 107 mL/min/{1.73_m2} (ref >59)
GFR calc non Af Amer: 93 mL/min/{1.73_m2} (ref >59)
Glucose: 157 mg/dL — ABNORMAL HIGH (ref 65–99)
Potassium: 4.2 mmol/L (ref 3.5–5.2)
Sodium: 139 mmol/L (ref 134–144)

## 2019-12-08 LAB — HEMOGLOBIN A1C
Est. average glucose Bld gHb Est-mCnc: 163 mg/dL
Hgb A1c MFr Bld: 7.3 % — ABNORMAL HIGH (ref 4.8–5.6)

## 2019-12-09 NOTE — Progress Notes (Signed)
This visit occurred during the SARS-CoV-2 public health emergency.  Safety protocols were in place, including screening questions prior to the visit, additional usage of staff PPE, and extensive cleaning of exam room while observing appropriate contact time as indicated for disinfecting solutions.  Subjective:     Patient ID: Anne Woodard , female    DOB: [birth year] , 64 y.o.   MRN: [id number]   Chief Complaint  Patient presents with  . Diabetes  . Hypertension    HPI  Diabetes She presents for her follow-up diabetic visit. She has type 2 diabetes mellitus. Her disease course has been stable. There are no hypoglycemic associated symptoms. Pertinent negatives for diabetes include no blurred vision and no chest pain. There are no hypoglycemic complications. Symptoms are worsening. Risk factors for coronary artery disease include diabetes mellitus, hypertension, obesity, sedentary lifestyle and post-menopausal.  Hypertension This is a chronic problem. The current episode started more than 1 year ago. The problem has been gradually improving since onset. The problem is controlled. Pertinent negatives include no blurred vision, chest pain, palpitations or shortness of breath.     Past Medical History:  Diagnosis Date  . Abnormal EKG 01/14/2018  . Depression   . Diabetes mellitus type 2 in obese ([place]) 01/14/2018  . Pure hypercholesterolemia 01/14/2018     Family History  Problem Relation Age of Onset  . Colon cancer Mother   . Endometrial cancer Mother   . Hypertension Mother   . Alzheimer's disease Father   . Multiple sclerosis Son   . Hypertension Son   . Depression Son   . Anxiety disorder Son   . Breast cancer Neg Hx      Current Outpatient Medications:  .  Ascorbic Acid (VITAMIN C PO), Take by mouth. Take one tablet daily, Disp: , Rfl:  .  clonazePAM (KLONOPIN) 1 MG tablet, TAKE 1/2 TABLET BY MOUTH TWICE A DAY AS NEEDED, Disp: 30 tablet, Rfl: 1 .  doxylamine, Sleep,  (UNISOM) 25 MG tablet, Take 25 mg by mouth at bedtime as needed., Disp: , Rfl:  .  escitalopram (LEXAPRO) 10 MG tablet, Take 1 tablet (10 mg total) by mouth daily., Disp: 90 tablet, Rfl: 2 .  losartan (COZAAR) 25 MG tablet, Take 1 tablet (25 mg total) by mouth daily., Disp: 90 tablet, Rfl: 2 .  Multiple Vitamin (MULTIVITAMIN) tablet, Take 1 tablet by mouth daily., Disp: , Rfl:  .  Vitamin D, Ergocalciferol, 50 MCG (2000 UT) CAPS, Take 50,000 Units by mouth every 7 (seven) days., Disp: , Rfl:  .  pravastatin (PRAVACHOL) 80 MG tablet, Take one tab po M-F, skip weekends, Disp: 90 tablet, Rfl: 2   No Known Allergies   Review of Systems  Constitutional: Negative.   Eyes: Negative for blurred vision.  Respiratory: Negative.  Negative for shortness of breath.   Cardiovascular: Negative.  Negative for chest pain and palpitations.  Gastrointestinal: Negative.   Neurological: Negative.   Psychiatric/Behavioral: Negative.      Today's Vitals   12/07/19 1546  BP: 124/80  Pulse: 89  Temp: 98.8 F (37.1 C)  TempSrc: Oral  Weight: 195 lb (88.5 kg)  Height: 5' 2.4" (1.585 m)  PainSc: 0-No pain   Body mass index is 35.21 kg/m.   Objective:  Physical Exam Vitals and nursing note reviewed.  Constitutional:      Appearance: Normal appearance. She is obese.  HENT:     Head: Normocephalic and atraumatic.  Cardiovascular:     Rate and  Rhythm: Normal rate and regular rhythm.     Heart sounds: Normal heart sounds.  Pulmonary:     Effort: Pulmonary effort is normal.     Breath sounds: Normal breath sounds.  Skin:    General: Skin is warm.  Neurological:     General: No focal deficit present.     Mental Status: She is alert.  Psychiatric:        Mood and Affect: Mood normal.        Behavior: Behavior normal.         Assessment And Plan:     1. Obesity, diabetes, and hypertension syndrome (HCC)  Chronic, I will check labs as listed below. Importance of dietary and medication  compliance was discussed with the patient. Her BP is well controlled, she will continue with current meds. She is encouraged to avoid adding salt to her foods. Her BMI is 35 and she is encouraged to strive for BMI less than 30 to decrease cardiac risk.   - BMP8+EGFR - Hemoglobin A1c  2. Estrogen deficiency  I will refer her for bone density. Advised to take calcium/vitamin D supplements and engage in weightbearing exercise.   - DG Bone Density; Future  3. Class 2 severe obesity due to excess calories with serious comorbidity and body mass index (BMI) of 35.0 to 35.9 in adult [facility])  She is encouraged to strive for BMi less than 30 to decrease cardiac risk. She is advised to exercise 30 minutes five days per week.   4. Screen for colon cancer  I will refer her to GI in [place] as requested.   - Ambulatory referral to Gastroenterology  5. Encounter for screening mammogram for malignant neoplasm of breast  She is encouraged to perform monthly self breast exams. I will also refer her fof mammo.   - MM Digital Screening; Future        [name], MD    THE PATIENT IS ENCOURAGED TO PRACTICE SOCIAL DISTANCING DUE TO THE COVID-19 PANDEMIC.

## 2019-12-12 ENCOUNTER — Telehealth: Payer: Self-pay

## 2019-12-12 ENCOUNTER — Other Ambulatory Visit: Payer: Self-pay

## 2019-12-12 DIAGNOSIS — Z800 Family history of malignant neoplasm of digestive organs: Secondary | ICD-10-CM

## 2019-12-12 DIAGNOSIS — Z1211 Encounter for screening for malignant neoplasm of colon: Secondary | ICD-10-CM

## 2019-12-12 NOTE — Telephone Encounter (Signed)
Gastroenterology Pre-Procedure Review  Request Date: Wed 01/31/20 Requesting Physician: Dr. Allegra Lai  PATIENT REVIEW QUESTIONS: The patient responded to the following health history questions as indicated:    1. Are you having any GI issues? no 2. Do you have a personal history of Polyps? yes (5 years ago) 3. Do you have a family history of Colon Cancer or Polyps? yes (mother had colon cancer) 4. Diabetes Mellitus? yes (tries to control with diet) 5. Joint replacements in the past 12 months?no 6. Major health problems in the past 3 months?no 7. Any artificial heart valves, MVP, or defibrillator?no    MEDICATIONS & ALLERGIES:    Patient reports the following regarding taking any anticoagulation/antiplatelet therapy:   Plavix, Coumadin, Eliquis, Xarelto, Lovenox, Pradaxa, Brilinta, or Effient? no Aspirin? no  Patient confirms/reports the following medications:  Current Outpatient Medications  Medication Sig Dispense Refill  . Ascorbic Acid (VITAMIN C PO) Take by mouth. Take one tablet daily    . clonazePAM (KLONOPIN) 1 MG tablet TAKE 1/2 TABLET BY MOUTH TWICE A DAY AS NEEDED 30 tablet 1  . doxylamine, Sleep, (UNISOM) 25 MG tablet Take 25 mg by mouth at bedtime as needed.    Marland Kitchen escitalopram (LEXAPRO) 10 MG tablet Take 1 tablet (10 mg total) by mouth daily. 90 tablet 2  . losartan (COZAAR) 25 MG tablet Take 1 tablet (25 mg total) by mouth daily. 90 tablet 2  . Multiple Vitamin (MULTIVITAMIN) tablet Take 1 tablet by mouth daily.    . pravastatin (PRAVACHOL) 80 MG tablet Take one tab po M-F, skip weekends 90 tablet 2  . Vitamin D, Ergocalciferol, 50 MCG (2000 UT) CAPS Take 50,000 Units by mouth every 7 (seven) days.     No current facility-administered medications for this visit.    Patient confirms/reports the following allergies:  No Known Allergies  No orders of the defined types were placed in this encounter.   AUTHORIZATION INFORMATION Primary Insurance: 1D#: Group  #:  Secondary Insurance: 1D#: Group #:  SCHEDULE INFORMATION: Date: Wed 04/28/21Time: Location:[facility]

## 2019-12-29 ENCOUNTER — Ambulatory Visit: Payer: Medicare Other | Attending: Internal Medicine

## 2019-12-29 DIAGNOSIS — Z23 Encounter for immunization: Secondary | ICD-10-CM

## 2019-12-29 NOTE — Progress Notes (Signed)
   Covid-19 Vaccination Clinic  Name:  GERICA KOBLE    MRN: [id number] DOB: 01-19-1956  12/29/2019  Ms. Seigler was observed post Covid-19 immunization for 15 minutes without incident. She was provided with Vaccine Information Sheet and instruction to access the V-Safe system.   Ms. Hamada was instructed to call 911 with any severe reactions post vaccine: Marland Kitchen Difficulty breathing  . Swelling of face and throat  . A fast heartbeat  . A bad rash all over body  . Dizziness and weakness   Immunizations Administered    Name Date Dose VIS Date Route   Pfizer COVID-19 Vaccine 12/29/2019  3:23 PM 0.3 mL 09/15/2019 Intramuscular   Manufacturer: [employer], [employer]   Lot: [id number]   NDC: [address]-1

## 2020-01-09 ENCOUNTER — Telehealth: Payer: Self-pay

## 2020-01-09 ENCOUNTER — Other Ambulatory Visit: Payer: Self-pay | Admitting: Internal Medicine

## 2020-01-09 NOTE — Telephone Encounter (Signed)
LVM retuning patients call to reschedule her 01/31/20 colonoscopy with Dr. Allegra Lai.  Asked her to call me back with a date to reschedule.  She will remain on the schedule until she calls back with a reschedule date.  Thanks,  [place], [place]

## 2020-01-10 NOTE — Telephone Encounter (Signed)
Last 12/07/19 Next 04/11/20

## 2020-01-16 ENCOUNTER — Other Ambulatory Visit: Payer: Self-pay | Admitting: Internal Medicine

## 2020-01-16 DIAGNOSIS — Z1231 Encounter for screening mammogram for malignant neoplasm of breast: Secondary | ICD-10-CM

## 2020-01-24 ENCOUNTER — Ambulatory Visit: Payer: Medicare Other | Attending: Internal Medicine

## 2020-01-24 DIAGNOSIS — Z23 Encounter for immunization: Secondary | ICD-10-CM

## 2020-01-24 NOTE — Progress Notes (Signed)
   Covid-19 Vaccination Clinic  Name:  Anne Woodard    MRN: [id number] DOB: 1955-10-27  01/24/2020  [name] was observed post Covid-19 immunization for 15 minutes without incident. She was provided with Vaccine Information Sheet and instruction to access the V-Safe system.   [name] was instructed to call 911 with any severe reactions post vaccine: Marland Kitchen Difficulty breathing  . Swelling of face and throat  . A fast heartbeat  . A bad rash all over body  . Dizziness and weakness   Immunizations Administered    Name Date Dose VIS Date Route   Pfizer COVID-19 Vaccine 01/24/2020  3:00 PM 0.3 mL 11/29/2018 Intramuscular   Manufacturer: [employer], [employer]   Lot: [id number]   NDC: [address]-1

## 2020-01-29 ENCOUNTER — Other Ambulatory Visit: Payer: Medicare Other | Attending: Gastroenterology

## 2020-01-31 ENCOUNTER — Ambulatory Visit: Admission: RE | Admit: 2020-01-31 | Payer: Medicare Other | Source: Home / Self Care | Admitting: Gastroenterology

## 2020-01-31 ENCOUNTER — Encounter: Admission: RE | Payer: Self-pay | Source: Home / Self Care

## 2020-01-31 SURGERY — COLONOSCOPY WITH PROPOFOL
Anesthesia: General

## 2020-04-10 ENCOUNTER — Encounter: Payer: Self-pay | Admitting: Internal Medicine

## 2020-04-11 ENCOUNTER — Other Ambulatory Visit: Payer: Self-pay

## 2020-04-11 ENCOUNTER — Ambulatory Visit (INDEPENDENT_AMBULATORY_CARE_PROVIDER_SITE_OTHER): Payer: 59 | Admitting: Internal Medicine

## 2020-04-11 VITALS — BP 128/76 | HR 86 | Temp 98.0°F | Ht 62.4 in | Wt 194.6 lb

## 2020-04-11 DIAGNOSIS — E66812 Obesity, class 2: Secondary | ICD-10-CM

## 2020-04-11 DIAGNOSIS — E1159 Type 2 diabetes mellitus with other circulatory complications: Secondary | ICD-10-CM

## 2020-04-11 DIAGNOSIS — E669 Obesity, unspecified: Secondary | ICD-10-CM

## 2020-04-11 DIAGNOSIS — Z23 Encounter for immunization: Secondary | ICD-10-CM

## 2020-04-11 DIAGNOSIS — E1169 Type 2 diabetes mellitus with other specified complication: Secondary | ICD-10-CM | POA: Diagnosis not present

## 2020-04-11 DIAGNOSIS — I10 Essential (primary) hypertension: Secondary | ICD-10-CM | POA: Diagnosis not present

## 2020-04-11 DIAGNOSIS — E119 Type 2 diabetes mellitus without complications: Secondary | ICD-10-CM

## 2020-04-11 DIAGNOSIS — Z6835 Body mass index (BMI) 35.0-35.9, adult: Secondary | ICD-10-CM

## 2020-04-11 MED ORDER — ONETOUCH DELICA LANCETS 33G MISC: 2 refills | Status: DC

## 2020-04-11 MED ORDER — PNEUMOCOCCAL 13-VAL CONJ VACC IM SUSP: 0.5000 mL | INTRAMUSCULAR | 0 refills | Status: AC

## 2020-04-11 MED ORDER — LOSARTAN POTASSIUM 25 MG PO TABS: 25.0000 mg | ORAL_TABLET | Freq: Every day | ORAL | 2 refills | Status: DC

## 2020-04-11 MED ORDER — ONETOUCH VERIO VI STRP: ORAL_STRIP | 2 refills | Status: AC

## 2020-04-11 NOTE — Progress Notes (Signed)
I,Katawbba Wiggins,acting as a Education administrator for [name], MD.,have documented all relevant documentation on the behalf of [name], MD,as directed by  [name], MD while in the presence of [name], MD.  This visit occurred during the SARS-CoV-2 public health emergency.  Safety protocols were in place, including screening questions prior to the visit, additional usage of staff PPE, and extensive cleaning of exam room while observing appropriate contact time as indicated for disinfecting solutions.  Subjective:     Patient ID: Anne Woodard , female    DOB: 02-12-1956 , 64 y.o.   MRN: [id number]   Chief Complaint  Patient presents with  . Diabetes  . Hypertension  . Medication Management    clonozepam refill    HPI  Diabetes She presents for her follow-up diabetic visit. She has type 2 diabetes mellitus. Her disease course has been stable. There are no hypoglycemic associated symptoms. Pertinent negatives for diabetes include no blurred vision and no chest pain. There are no hypoglycemic complications. Symptoms are worsening. Risk factors for coronary artery disease include diabetes mellitus, hypertension, obesity, sedentary lifestyle and post-menopausal. Eye exam is not current.  Hypertension This is a chronic problem. The current episode started more than 1 year ago. The problem has been gradually improving since onset. The problem is controlled. Pertinent negatives include no blurred vision, chest pain, palpitations or shortness of breath. Risk factors for coronary artery disease include diabetes mellitus, dyslipidemia, obesity, sedentary lifestyle and post-menopausal state. The current treatment provides moderate improvement. Compliance problems include exercise.      Past Medical History:  Diagnosis Date  . Abnormal EKG 01/14/2018  . Depression   . Diabetes mellitus type 2 in obese ([place]) 01/14/2018  . Pure hypercholesterolemia 01/14/2018     Family History   Problem Relation Age of Onset  . Colon cancer Mother   . Endometrial cancer Mother   . Hypertension Mother   . Alzheimer's disease Father   . Multiple sclerosis Son   . Hypertension Son   . Depression Son   . Anxiety disorder Son   . Breast cancer Neg Hx      Current Outpatient Medications:  .  Ascorbic Acid (VITAMIN C PO), Take by mouth. Take one tablet daily, Disp: , Rfl:  .  clonazePAM (KLONOPIN) 1 MG tablet, TAKE 1/2 TABLET BY MOUTH TWICE DAILY AS NEEDED, Disp: 30 tablet, Rfl: 0 .  doxylamine, Sleep, (UNISOM) 25 MG tablet, Take 25 mg by mouth at bedtime as needed., Disp: , Rfl:  .  escitalopram (LEXAPRO) 10 MG tablet, Take 1 tablet (10 mg total) by mouth daily., Disp: 90 tablet, Rfl: 2 .  Multiple Vitamin (MULTIVITAMIN) tablet, Take 1 tablet by mouth daily., Disp: , Rfl:  .  pravastatin (PRAVACHOL) 80 MG tablet, Take one tab po M-F, skip weekends, Disp: 90 tablet, Rfl: 2 .  rosuvastatin (CRESTOR) 10 MG tablet, Take by mouth., Disp: , Rfl:  .  Vitamin D, Ergocalciferol, 50 MCG (2000 UT) CAPS, Take 50,000 Units by mouth every 7 (seven) days., Disp: , Rfl:  .  glucose blood (ONETOUCH VERIO) test strip, Use as directed to check blood sugars daily dx: e11.69, Disp: 100 each, Rfl: 2 .  losartan (COZAAR) 25 MG tablet, Take 1 tablet (25 mg total) by mouth daily., Disp: 90 tablet, Rfl: 2 .  OneTouch Delica Lancets 81K MISC, Use as directed to check blood sugars daily dx: e11.69, Disp: 100 each, Rfl: 2   No Known Allergies  Review of Systems  Constitutional: Negative.   Eyes: Negative for blurred vision.  Respiratory: Negative.  Negative for shortness of breath.   Cardiovascular: Negative.  Negative for chest pain and palpitations.  Gastrointestinal: Negative.   Neurological: Negative.   Psychiatric/Behavioral: Negative.      Today's Vitals   04/11/20 1504  BP: 128/76  Pulse: 86  Temp: 98 F (36.7 C)  TempSrc: Oral  Weight: 194 lb 9.6 oz (88.3 kg)  Height: 5' 2.4" (1.585 m)   PainSc: 0-No pain   Body mass index is 35.14 kg/m.   Objective:  Physical Exam Vitals and nursing note reviewed.  Constitutional:      Appearance: Normal appearance.  HENT:     Head: Normocephalic and atraumatic.  Cardiovascular:     Rate and Rhythm: Normal rate and regular rhythm.     Heart sounds: Normal heart sounds.  Pulmonary:     Effort: Pulmonary effort is normal.     Breath sounds: Normal breath sounds.  Skin:    General: Skin is warm.  Neurological:     General: No focal deficit present.     Mental Status: She is alert.  Psychiatric:        Mood and Affect: Mood normal.        Behavior: Behavior normal.         Assessment And Plan:  1. Obesity, diabetes, and hypertension syndrome ([place])  Importance of dietary, medication and exercise compliance was discussed with the patient.  She is encouraged to schedule eye exam within the next four weeks. I will check labs as listed below. She will rto in four months for re-evaluation. Her BP is well controlled. She is encouraged to aim for at least 150 minutes of exercise per week.   - Hemoglobin A1c - CMP14+EGFR - Lipid panel    2. Class 2 severe obesity due to excess calories with serious comorbidity and body mass index (BMI) of 35.0 to 35.9 in adult [facility])  She is encouraged to strive for BMI less than 30 to decrease cardiac risk.   3. Immunization due  Rx Prevnar-13 was sent to her local pharmacy. She is encouraged to get this within the next two weeks.       [name], MD   I, [name], MD, have reviewed all documentation for this visit. The documentation on 04/13/20 for the exam, diagnosis, procedures, and orders are all accurate and complete.  THE PATIENT IS ENCOURAGED TO PRACTICE SOCIAL DISTANCING DUE TO THE COVID-19 PANDEMIC.

## 2020-04-11 NOTE — Patient Instructions (Signed)
Diabetes Mellitus and Exercise Exercising regularly is important for your overall health, especially when you have diabetes (diabetes mellitus). Exercising is not only about losing weight. It has many other health benefits, such as increasing muscle strength and bone density and reducing body fat and stress. This leads to improved fitness, flexibility, and endurance, all of which result in better overall health. Exercise has additional benefits for people with diabetes, including:  Reducing appetite.  Helping to lower and control blood glucose.  Lowering blood pressure.  Helping to control amounts of fatty substances (lipids) in the blood, such as cholesterol and triglycerides.  Helping the body to respond better to insulin (improving insulin sensitivity).  Reducing how much insulin the body needs.  Decreasing the risk for heart disease by: ? Lowering cholesterol and triglyceride levels. ? Increasing the levels of good cholesterol. ? Lowering blood glucose levels. What is my activity plan? Your health care provider or certified diabetes educator can help you make a plan for the type and frequency of exercise (activity plan) that works for you. Make sure that you:  Do at least 150 minutes of moderate-intensity or vigorous-intensity exercise each week. This could be brisk walking, biking, or water aerobics. ? Do stretching and strength exercises, such as yoga or weightlifting, at least 2 times a week. ? Spread out your activity over at least 3 days of the week.  Get some form of physical activity every day. ? Do not go more than 2 days in a row without some kind of physical activity. ? Avoid being inactive for more than 30 minutes at a time. Take frequent breaks to walk or stretch.  Choose a type of exercise or activity that you enjoy, and set realistic goals.  Start slowly, and gradually increase the intensity of your exercise over time. What do I need to know about managing my  diabetes?   Check your blood glucose before and after exercising. ? If your blood glucose is 240 mg/dL (13.3 mmol/L) or higher before you exercise, check your urine for ketones. If you have ketones in your urine, do not exercise until your blood glucose returns to normal. ? If your blood glucose is 100 mg/dL (5.6 mmol/L) or lower, eat a snack containing 15-20 grams of carbohydrate. Check your blood glucose 15 minutes after the snack to make sure that your level is above 100 mg/dL (5.6 mmol/L) before you start your exercise.  Know the symptoms of low blood glucose (hypoglycemia) and how to treat it. Your risk for hypoglycemia increases during and after exercise. Common symptoms of hypoglycemia can include: ? Hunger. ? Anxiety. ? Sweating and feeling clammy. ? Confusion. ? Dizziness or feeling light-headed. ? Increased heart rate or palpitations. ? Blurry vision. ? Tingling or numbness around the mouth, lips, or tongue. ? Tremors or shakes. ? Irritability.  Keep a rapid-acting carbohydrate snack available before, during, and after exercise to help prevent or treat hypoglycemia.  Avoid injecting insulin into areas of the body that are going to be exercised. For example, avoid injecting insulin into: ? The arms, when playing tennis. ? The legs, when jogging.  Keep records of your exercise habits. Doing this can help you and your health care provider adjust your diabetes management plan as needed. Write down: ? Food that you eat before and after you exercise. ? Blood glucose levels before and after you exercise. ? The type and amount of exercise you have done. ? When your insulin is expected to peak, if you use   insulin. Avoid exercising at times when your insulin is peaking.  When you start a new exercise or activity, work with your health care provider to make sure the activity is safe for you, and to adjust your insulin, medicines, or food intake as needed.  Drink plenty of water while  you exercise to prevent dehydration or heat stroke. Drink enough fluid to keep your urine clear or pale yellow. Summary  Exercising regularly is important for your overall health, especially when you have diabetes (diabetes mellitus).  Exercising has many health benefits, such as increasing muscle strength and bone density and reducing body fat and stress.  Your health care provider or certified diabetes educator can help you make a plan for the type and frequency of exercise (activity plan) that works for you.  When you start a new exercise or activity, work with your health care provider to make sure the activity is safe for you, and to adjust your insulin, medicines, or food intake as needed. This information is not intended to replace advice given to you by your health care provider. Make sure you discuss any questions you have with your health care provider. Document Revised: 04/15/2017 Document Reviewed: 03/02/2016 Elsevier Patient Education  2020 Elsevier Inc.  

## 2020-04-12 ENCOUNTER — Other Ambulatory Visit: Payer: Self-pay | Admitting: Internal Medicine

## 2020-04-12 ENCOUNTER — Encounter: Payer: Self-pay | Admitting: Internal Medicine

## 2020-04-12 LAB — CMP14+EGFR
ALT: 46 IU/L — ABNORMAL HIGH (ref 0–32)
AST: 44 IU/L — ABNORMAL HIGH (ref 0–40)
Albumin/Globulin Ratio: 1.5 (ref 1.2–2.2)
Albumin: 4.5 g/dL (ref 3.8–4.8)
Alkaline Phosphatase: 113 IU/L (ref 48–121)
BUN/Creatinine Ratio: 16 (ref 12–28)
BUN: 11 mg/dL (ref 8–27)
Bilirubin Total: 0.3 mg/dL (ref 0.0–1.2)
CO2: 21 mmol/L (ref 20–29)
Calcium: 10 mg/dL (ref 8.7–10.3)
Chloride: 103 mmol/L (ref 96–106)
Creatinine, Ser: 0.67 mg/dL (ref 0.57–1.00)
GFR calc Af Amer: 107 mL/min/{1.73_m2} (ref >59)
GFR calc non Af Amer: 93 mL/min/{1.73_m2} (ref >59)
Globulin, Total: 3.1 g/dL (ref 1.5–4.5)
Glucose: 143 mg/dL — ABNORMAL HIGH (ref 65–99)
Potassium: 4.2 mmol/L (ref 3.5–5.2)
Sodium: 141 mmol/L (ref 134–144)
Total Protein: 7.6 g/dL (ref 6.0–8.5)

## 2020-04-12 LAB — LIPID PANEL
Chol/HDL Ratio: 4.8 ratio — ABNORMAL HIGH (ref 0.0–4.4)
Cholesterol, Total: 220 mg/dL — ABNORMAL HIGH (ref 100–199)
HDL: 46 mg/dL (ref >39)
LDL Chol Calc (NIH): 134 mg/dL — ABNORMAL HIGH (ref 0–99)
Triglycerides: 222 mg/dL — ABNORMAL HIGH (ref 0–149)
VLDL Cholesterol Cal: 40 mg/dL (ref 5–40)

## 2020-04-12 LAB — HEMOGLOBIN A1C
Est. average glucose Bld gHb Est-mCnc: 163 mg/dL
Hgb A1c MFr Bld: 7.3 % — ABNORMAL HIGH (ref 4.8–5.6)

## 2020-04-12 NOTE — Progress Notes (Signed)
Rx clonazepam 1mg  1/2 tab po twice daily prn #30/0 refills called into [facility] [address] in [place], [place].  My [employer] ID is not working. [id number] contacted, advised that I would not get a call back until Monday 04/15/20.  I spoke directly with pharmacist who took my DEA and ran the rx while I was on the phone.

## 2020-04-12 NOTE — Telephone Encounter (Signed)
Clonazepam refill 

## 2020-04-15 ENCOUNTER — Encounter: Payer: Self-pay | Admitting: Internal Medicine

## 2020-05-13 ENCOUNTER — Ambulatory Visit
Admission: RE | Admit: 2020-05-13 | Discharge: 2020-05-13 | Disposition: A | Discharge status: Home / Self Care | Payer: 59 | Source: Ambulatory Visit | Attending: Internal Medicine | Admitting: Internal Medicine

## 2020-05-13 ENCOUNTER — Other Ambulatory Visit: Payer: Self-pay

## 2020-05-13 DIAGNOSIS — Z1231 Encounter for screening mammogram for malignant neoplasm of breast: Secondary | ICD-10-CM

## 2020-05-13 DIAGNOSIS — E2839 Other primary ovarian failure: Secondary | ICD-10-CM | POA: Diagnosis present

## 2020-06-28 LAB — HM DIABETES EYE EXAM

## 2020-06-30 ENCOUNTER — Encounter: Payer: Self-pay | Admitting: Internal Medicine

## 2020-07-01 ENCOUNTER — Other Ambulatory Visit: Payer: Self-pay

## 2020-07-01 MED ORDER — ROSUVASTATIN CALCIUM 10 MG PO TABS: 10.0000 mg | ORAL_TABLET | Freq: Every day | ORAL | 1 refills | Status: DC

## 2020-07-02 ENCOUNTER — Encounter: Payer: Self-pay | Admitting: Internal Medicine

## 2020-07-02 ENCOUNTER — Ambulatory Visit: Payer: Self-pay

## 2020-07-11 ENCOUNTER — Other Ambulatory Visit: Payer: Self-pay

## 2020-07-11 ENCOUNTER — Encounter: Payer: Self-pay | Admitting: Internal Medicine

## 2020-07-11 ENCOUNTER — Ambulatory Visit (INDEPENDENT_AMBULATORY_CARE_PROVIDER_SITE_OTHER): Payer: 59

## 2020-07-11 ENCOUNTER — Encounter: Payer: Medicare Other | Admitting: Internal Medicine

## 2020-07-11 ENCOUNTER — Ambulatory Visit (INDEPENDENT_AMBULATORY_CARE_PROVIDER_SITE_OTHER): Payer: 59 | Admitting: Internal Medicine

## 2020-07-11 VITALS — BP 110/68 | HR 81 | Temp 98.1°F | Ht 62.0 in | Wt 194.0 lb

## 2020-07-11 VITALS — BP 110/68 | HR 81 | Temp 98.1°F | Ht 62.0 in | Wt 194.8 lb

## 2020-07-11 DIAGNOSIS — I152 Hypertension secondary to endocrine disorders: Secondary | ICD-10-CM

## 2020-07-11 DIAGNOSIS — Z0000 Encounter for general adult medical examination without abnormal findings: Secondary | ICD-10-CM

## 2020-07-11 DIAGNOSIS — E669 Obesity, unspecified: Secondary | ICD-10-CM | POA: Diagnosis not present

## 2020-07-11 DIAGNOSIS — E1159 Type 2 diabetes mellitus with other circulatory complications: Secondary | ICD-10-CM

## 2020-07-11 DIAGNOSIS — Z6835 Body mass index (BMI) 35.0-35.9, adult: Secondary | ICD-10-CM

## 2020-07-11 DIAGNOSIS — F419 Anxiety disorder, unspecified: Secondary | ICD-10-CM

## 2020-07-11 DIAGNOSIS — E1169 Type 2 diabetes mellitus with other specified complication: Secondary | ICD-10-CM

## 2020-07-11 DIAGNOSIS — Z23 Encounter for immunization: Secondary | ICD-10-CM

## 2020-07-11 LAB — POCT URINALYSIS DIPSTICK
Bilirubin, UA: NEGATIVE
Blood, UA: NEGATIVE
Glucose, UA: NEGATIVE
Ketones, UA: NEGATIVE
Leukocytes, UA: NEGATIVE
Nitrite, UA: POSITIVE
Protein, UA: NEGATIVE
Spec Grav, UA: >=1.03 — AB (ref 1.010–1.025)
Urobilinogen, UA: 0.2 E.U./dL
pH, UA: 5.5 (ref 5.0–8.0)

## 2020-07-11 LAB — POCT UA - MICROALBUMIN
Albumin/Creatinine Ratio, Urine, POC: <30
Creatinine, POC: 300 mg/dL
Microalbumin Ur, POC: 10 mg/L

## 2020-07-11 MED ORDER — CLONAZEPAM 1 MG PO TABS: ORAL_TABLET | ORAL | 0 refills | Status: DC

## 2020-07-11 MED ORDER — ESCITALOPRAM OXALATE 10 MG PO TABS: 10.0000 mg | ORAL_TABLET | Freq: Every day | ORAL | 2 refills | Status: DC

## 2020-07-11 NOTE — Patient Instructions (Signed)
Diabetes Mellitus and Exercise Exercising regularly is important for your overall health, especially when you have diabetes (diabetes mellitus). Exercising is not only about losing weight. It has many other health benefits, such as increasing muscle strength and bone density and reducing body fat and stress. This leads to improved fitness, flexibility, and endurance, all of which result in better overall health. Exercise has additional benefits for people with diabetes, including:  Reducing appetite.  Helping to lower and control blood glucose.  Lowering blood pressure.  Helping to control amounts of fatty substances (lipids) in the blood, such as cholesterol and triglycerides.  Helping the body to respond better to insulin (improving insulin sensitivity).  Reducing how much insulin the body needs.  Decreasing the risk for heart disease by: ? Lowering cholesterol and triglyceride levels. ? Increasing the levels of good cholesterol. ? Lowering blood glucose levels. What is my activity plan? Your health care provider or certified diabetes educator can help you make a plan for the type and frequency of exercise (activity plan) that works for you. Make sure that you:  Do at least 150 minutes of moderate-intensity or vigorous-intensity exercise each week. This could be brisk walking, biking, or water aerobics. ? Do stretching and strength exercises, such as yoga or weightlifting, at least 2 times a week. ? Spread out your activity over at least 3 days of the week.  Get some form of physical activity every day. ? Do not go more than 2 days in a row without some kind of physical activity. ? Avoid being inactive for more than 30 minutes at a time. Take frequent breaks to walk or stretch.  Choose a type of exercise or activity that you enjoy, and set realistic goals.  Start slowly, and gradually increase the intensity of your exercise over time. What do I need to know about managing my  diabetes?   Check your blood glucose before and after exercising. ? If your blood glucose is 240 mg/dL (13.3 mmol/L) or higher before you exercise, check your urine for ketones. If you have ketones in your urine, do not exercise until your blood glucose returns to normal. ? If your blood glucose is 100 mg/dL (5.6 mmol/L) or lower, eat a snack containing 15-20 grams of carbohydrate. Check your blood glucose 15 minutes after the snack to make sure that your level is above 100 mg/dL (5.6 mmol/L) before you start your exercise.  Know the symptoms of low blood glucose (hypoglycemia) and how to treat it. Your risk for hypoglycemia increases during and after exercise. Common symptoms of hypoglycemia can include: ? Hunger. ? Anxiety. ? Sweating and feeling clammy. ? Confusion. ? Dizziness or feeling light-headed. ? Increased heart rate or palpitations. ? Blurry vision. ? Tingling or numbness around the mouth, lips, or tongue. ? Tremors or shakes. ? Irritability.  Keep a rapid-acting carbohydrate snack available before, during, and after exercise to help prevent or treat hypoglycemia.  Avoid injecting insulin into areas of the body that are going to be exercised. For example, avoid injecting insulin into: ? The arms, when playing tennis. ? The legs, when jogging.  Keep records of your exercise habits. Doing this can help you and your health care provider adjust your diabetes management plan as needed. Write down: ? Food that you eat before and after you exercise. ? Blood glucose levels before and after you exercise. ? The type and amount of exercise you have done. ? When your insulin is expected to peak, if you use   insulin. Avoid exercising at times when your insulin is peaking.  When you start a new exercise or activity, work with your health care provider to make sure the activity is safe for you, and to adjust your insulin, medicines, or food intake as needed.  Drink plenty of water while  you exercise to prevent dehydration or heat stroke. Drink enough fluid to keep your urine clear or pale yellow. Summary  Exercising regularly is important for your overall health, especially when you have diabetes (diabetes mellitus).  Exercising has many health benefits, such as increasing muscle strength and bone density and reducing body fat and stress.  Your health care provider or certified diabetes educator can help you make a plan for the type and frequency of exercise (activity plan) that works for you.  When you start a new exercise or activity, work with your health care provider to make sure the activity is safe for you, and to adjust your insulin, medicines, or food intake as needed. This information is not intended to replace advice given to you by your health care provider. Make sure you discuss any questions you have with your health care provider. Document Revised: 04/15/2017 Document Reviewed: 03/02/2016 Elsevier Patient Education  2020 Elsevier Inc.  

## 2020-07-11 NOTE — Progress Notes (Signed)
I,Katawbba Wiggins,acting as a Education administrator for [name], MD.,have documented all relevant documentation on the behalf of [name], MD,as directed by  [name], MD while in the presence of [name], MD. This visit occurred during the SARS-CoV-2 public health emergency.  Safety protocols were in place, including screening questions prior to the visit, additional usage of staff PPE, and extensive cleaning of exam room while observing appropriate contact time as indicated for disinfecting solutions.  Subjective:     Patient ID: Anne Woodard , female    DOB: December 16, 1955 , 64 y.o.   MRN: [id number]   Chief Complaint  Patient presents with  . Diabetes  . Hypertension    HPI  The patient is here today for a follow-up on her diabetes and blood pressure. She reports compliance with meds. Admits she is not getting enough exercise.  Diabetes She presents for her follow-up diabetic visit. She has type 2 diabetes mellitus. Her disease course has been stable. There are no hypoglycemic associated symptoms. Pertinent negatives for diabetes include no blurred vision and no chest pain. There are no hypoglycemic complications. Symptoms are worsening. Risk factors for coronary artery disease include diabetes mellitus, hypertension, obesity, sedentary lifestyle and post-menopausal. Eye exam is not current.  Hypertension This is a chronic problem. The current episode started more than 1 year ago. The problem has been gradually improving since onset. The problem is controlled. Pertinent negatives include no blurred vision, chest pain, palpitations or shortness of breath. Risk factors for coronary artery disease include diabetes mellitus, dyslipidemia, obesity, sedentary lifestyle and post-menopausal state. The current treatment provides moderate improvement. Compliance problems include exercise.      Past Medical History:  Diagnosis Date  . Abnormal EKG 01/14/2018  . Depression   .  Diabetes mellitus type 2 in obese ([place]) 01/14/2018  . Pure hypercholesterolemia 01/14/2018     Family History  Problem Relation Age of Onset  . Colon cancer Mother   . Endometrial cancer Mother   . Hypertension Mother   . Alzheimer's disease Father   . Multiple sclerosis Son   . Hypertension Son   . Depression Son   . Anxiety disorder Son   . Breast cancer Neg Hx      Current Outpatient Medications:  .  Ascorbic Acid (VITAMIN C PO), Take by mouth. Take one tablet daily, Disp: , Rfl:  .  clonazePAM (KLONOPIN) 1 MG tablet, TAKE 1/2 TABLET BY MOUTH TWICE DAILY AS NEEDED, Disp: 30 tablet, Rfl: 0 .  doxylamine, Sleep, (UNISOM) 25 MG tablet, Take 25 mg by mouth at bedtime as needed., Disp: , Rfl:  .  escitalopram (LEXAPRO) 10 MG tablet, Take 1 tablet (10 mg total) by mouth daily., Disp: 90 tablet, Rfl: 2 .  glucose blood (ONETOUCH VERIO) test strip, Use as directed to check blood sugars daily dx: e11.69, Disp: 100 each, Rfl: 2 .  losartan (COZAAR) 25 MG tablet, Take 1 tablet (25 mg total) by mouth daily., Disp: 90 tablet, Rfl: 2 .  Multiple Vitamin (MULTIVITAMIN) tablet, Take 1 tablet by mouth daily., Disp: , Rfl:  .  OneTouch Delica Lancets 40J MISC, Use as directed to check blood sugars daily dx: e11.69, Disp: 100 each, Rfl: 2 .  rosuvastatin (CRESTOR) 10 MG tablet, Take 1 tablet (10 mg total) by mouth daily., Disp: 90 tablet, Rfl: 1 .  Vitamin D, Ergocalciferol, 50 MCG (2000 UT) CAPS, Take 2,000 Units by mouth daily. Every once and a while, Disp: ,  Rfl:    No Known Allergies   Review of Systems  Constitutional: Negative.   Eyes: Negative for blurred vision.  Respiratory: Negative.  Negative for shortness of breath.   Cardiovascular: Negative.  Negative for chest pain and palpitations.  Gastrointestinal: Negative.   Psychiatric/Behavioral: Negative.   All other systems reviewed and are negative.    Today's Vitals   07/11/20 1057  BP: 110/68  Pulse: 81  Temp: 98.1 F (36.7 C)   TempSrc: Oral  Weight: 194 lb 12.8 oz (88.4 kg)  Height: _0  (1.575 m)   Body mass index is 35.63 kg/m.   Objective:  Physical Exam Vitals and nursing note reviewed.  Constitutional:      Appearance: Normal appearance. She is obese.  HENT:     Head: Normocephalic and atraumatic.  Cardiovascular:     Rate and Rhythm: Normal rate and regular rhythm.     Heart sounds: Normal heart sounds.  Pulmonary:     Breath sounds: Normal breath sounds.  Skin:    General: Skin is warm.  Neurological:     General: No focal deficit present.     Mental Status: She is alert and oriented to person, place, and time.         Assessment And Plan:     1. Obesity, diabetes, and hypertension syndrome ([place]) Comments: BP well controlled. Diabetic foot exam was performed.  I will check labs as listed below. Encouraged to follow dietary guidelines. Advised to aim for at least 150 minutes of exercise per week. - Hemoglobin A1c - BMP8+EGFR - CBC no Diff  2. Anxiety Comments: Chronic, she was given refill of clonazepam. PDMP data was reviewed as well.   3. Class 2 severe obesity due to excess calories with serious comorbidity and body mass index (BMI) of 35.0 to 35.9 in adult [facility]) Comments: Encouraged to strive for BMI less than 30 to decrease cardiac risk. Again, encouraged to incresae her daily activity level.   4. Need for vaccination Comments: She was given high dose flu vaccine.  - Flu Vaccine QUAD High Dose(Fluad)    Patient was given opportunity to ask questions. Patient verbalized understanding of the plan and was able to repeat key elements of the plan. All questions were answered to their satisfaction.  [name], MD   I, [name], MD, have reviewed all documentation for this visit. The documentation on 07/13/20 for the exam, diagnosis, procedures, and orders are all accurate and complete.  THE PATIENT IS ENCOURAGED TO PRACTICE SOCIAL DISTANCING DUE TO THE COVID-19  PANDEMIC.

## 2020-07-11 NOTE — Progress Notes (Signed)
This visit occurred during the SARS-CoV-2 public health emergency.  Safety protocols were in place, including screening questions prior to the visit, additional usage of staff PPE, and extensive cleaning of exam room while observing appropriate contact time as indicated for disinfecting solutions.  Subjective:   Anne Woodard is a 64 y.o. female who presents for Medicare Annual (Subsequent) preventive examination.  Review of Systems     Cardiac Risk Factors include: advanced age (>5men, >76 women);diabetes mellitus;obesity (BMI >30kg/m2);sedentary lifestyle     Objective:    Today's Vitals   07/11/20 1106  BP: 110/68  Pulse: 81  Temp: 98.1 F (36.7 C)  TempSrc: Oral  Weight: 194 lb (88 kg)  Height: 5\' 2"  (1.575 m)   Body mass index is 35.48 kg/m.  Advanced Directives 07/11/2020 06/27/2019 06/29/2018 07/02/2016  Does Patient Have a Medical Advance Directive? No No No No  Would patient like information on creating a medical advance directive? Yes (MAU/Ambulatory/Procedural Areas - Information given) - No - Patient declined No - patient declined information    Current Medications (verified) Outpatient Encounter Medications as of 07/11/2020  Medication Sig  . Ascorbic Acid (VITAMIN C PO) Take by mouth. Take one tablet daily  . clonazePAM (KLONOPIN) 1 MG tablet TAKE 1/2 TABLET BY MOUTH TWICE DAILY AS NEEDED  . doxylamine, Sleep, (UNISOM) 25 MG tablet Take 25 mg by mouth at bedtime as needed.  09/10/2020 escitalopram (LEXAPRO) 10 MG tablet Take 1 tablet (10 mg total) by mouth daily.  Marland Kitchen glucose blood (ONETOUCH VERIO) test strip Use as directed to check blood sugars daily dx: e11.69  . losartan (COZAAR) 25 MG tablet Take 1 tablet (25 mg total) by mouth daily.  . Multiple Vitamin (MULTIVITAMIN) tablet Take 1 tablet by mouth daily.  Marland Kitchen Delica Lancets 33G MISC Use as directed to check blood sugars daily dx: e11.69  . rosuvastatin (CRESTOR) 10 MG tablet Take 1 tablet (10 mg total) by mouth  daily.  . Vitamin D, Ergocalciferol, 50 MCG (2000 UT) CAPS Take 2,000 Units by mouth daily. Every once and a while   No facility-administered encounter medications on file as of 07/11/2020.    Allergies (verified) Patient has no known allergies.   History: Past Medical History:  Diagnosis Date  . Abnormal EKG 01/14/2018  . Depression   . Diabetes mellitus type 2 in obese (HCC) 01/14/2018  . Pure hypercholesterolemia 01/14/2018   History reviewed. No pertinent surgical history. Family History  Problem Relation Age of Onset  . Colon cancer Mother   . Endometrial cancer Mother   . Hypertension Mother   . Alzheimer's disease Father   . Multiple sclerosis Son   . Hypertension Son   . Depression Son   . Anxiety disorder Son   . Breast cancer Neg Hx    Social History   Socioeconomic History  . Marital status: Married    Spouse name: Not on file  . Number of children: Not on file  . Years of education: Not on file  . Highest education level: Not on file  Occupational History  . Not on file  Tobacco Use  . Smoking status: Never Smoker  . Smokeless tobacco: Never Used  . Tobacco comment: Not applicable.   Vaping Use  . Vaping Use: Never used  Substance and Sexual Activity  . Alcohol use: No  . Drug use: Not Currently  . Sexual activity: Not on file  Other Topics Concern  . Not on file  Social History Narrative  .  Not on file   Social Determinants of Health   Financial Resource Strain: Low Risk   . Difficulty of Paying Living Expenses: Not hard at all  Food Insecurity: No Food Insecurity  . Worried About Programme researcher, broadcasting/film/video in the Last Year: Never true  . Ran Out of Food in the Last Year: Never true  Transportation Needs: No Transportation Needs  . Lack of Transportation (Medical): No  . Lack of Transportation (Non-Medical): No  Physical Activity: Inactive  . Days of Exercise per Week: 0 days  . Minutes of Exercise per Session: 0 min  Stress: No Stress Concern  Present  . Feeling of Stress : Not at all  Social Connections:   . Frequency of Communication with Friends and Family: Not on file  . Frequency of Social Gatherings with Friends and Family: Not on file  . Attends Religious Services: Not on file  . Active Member of Clubs or Organizations: Not on file  . Attends Banker Meetings: Not on file  . Marital Status: Not on file    Tobacco Counseling Counseling given: Not Answered Comment: Not applicable.    Clinical Intake:  Pre-visit preparation completed: Yes  Pain : No/denies pain     Nutritional Status: BMI > 30  Obese Nutritional Risks: None Diabetes: Yes  How often do you need to have someone help you when you read instructions, pamphlets, or other written materials from your doctor or pharmacy?: 1 - Never What is the last grade level you completed in school?: master's degree  Diabetic? Yes Nutrition Risk Assessment:  Has the patient had any N/V/D within the last 2 months?  No  Does the patient have any non-healing wounds?  No  Has the patient had any unintentional weight loss or weight gain?  No   Diabetes:  Is the patient diabetic?  Yes  If diabetic, was a CBG obtained today?  No  Did the patient bring in their glucometer from home?  No  How often do you monitor your CBG's? weekly.   Financial Strains and Diabetes Management:  Are you having any financial strains with the device, your supplies or your medication? No .  Does the patient want to be seen by Chronic Care Management for management of their diabetes?  No  Would the patient like to be referred to a Nutritionist or for Diabetic Management?  No   Diabetic Exams:  Diabetic Eye Exam: Completed 06/30/2020 Diabetic Foot Exam: Completed today  Interpreter Needed?: No  Information entered by :: NAllen LPN   Activities of Daily Living In your present state of health, do you have any difficulty performing the following activities: 07/11/2020  07/11/2020  Hearing? N N  Vision? N Y  Comment a little pt states she has cataracts now  Difficulty concentrating or making decisions? N N  Walking or climbing stairs? N N  Dressing or bathing? N N  Doing errands, shopping? N N  Preparing Food and eating ? N -  Using the Toilet? N -  In the past six months, have you accidently leaked urine? N -  Do you have problems with loss of bowel control? N -  Managing your Medications? N -  Managing your Finances? N -  Housekeeping or managing your Housekeeping? N -  Some recent data might be hidden    Patient Care Team: [name], MD as PCP - General (Internal Medicine)  Indicate any recent Medical Services you may have received from other than  [facility] providers in the past year (date may be approximate).     Assessment:   This is a routine wellness examination for [place].  Hearing/Vision screen  Hearing Screening   125Hz  250Hz  500Hz  1000Hz  2000Hz  3000Hz  4000Hz  6000Hz  8000Hz   Right ear:           Left ear:           Vision Screening Comments: Regular eye exams, [facility]  Dietary issues and exercise activities discussed: Current Exercise Habits: The patient does not participate in regular exercise at present  Goals    . Patient Stated     07/11/2020, going to join silver sneakers    . Weight (lb) < 200 lb (90.7 kg)     She plans to walk more. She wants to lose 20 pounds.       Depression Screen PHQ 2/9 Scores 07/11/2020 12/07/2019 06/27/2019 10/15/2018 09/27/2018  PHQ - 2 Score 0 0 0 2 0  PHQ- 9 Score - - - 2 -    Fall Risk Fall Risk  07/11/2020 12/07/2019 06/27/2019  Falls in the past year? 0 0 0  Risk for fall due to : Medication side effect - -  Follow up Falls evaluation completed;Education provided;Falls prevention discussed - -    Any stairs in or around the home? Yes  If so, are there any without handrails? No  Home free of loose throw rugs in walkways, pet beds, electrical cords, etc? Yes  Adequate lighting in your  home to reduce risk of falls? Yes   ASSISTIVE DEVICES UTILIZED TO PREVENT FALLS:  Life alert? No  Use of a cane, walker or w/c? No  Grab bars in the bathroom? No  Shower chair or bench in shower? Yes  Elevated toilet seat or a handicapped toilet? No   TIMED UP AND GO:  Was the test performed? No . .   Gait steady and fast without use of assistive device  Cognitive Function:     6CIT Screen 07/11/2020 06/27/2019 06/27/2019  What Year? 0 points 0 points 0 points  What month? 0 points 0 points 0 points  What time? 0 points 0 points 0 points  Count back from 20 0 points 0 points 0 points  Months in reverse 0 points 0 points 0 points  Repeat phrase 0 points 0 points 0 points  Total Score 0 0 0    Immunizations Immunization History  Administered Date(s) Administered  . Fluad Quad(high Dose 65+) 07/11/2020  . Influenza Inj Mdck Quad Pf 07/29/2018  . Influenza, High Dose Seasonal PF 06/27/2019  . Influenza,inj,Quad PF,6+ Mos 07/15/2017  . Influenza-Unspecified 06/19/2018  . PFIZER SARS-COV-2 Vaccination 12/29/2019, 01/24/2020  . Pneumococcal Polysaccharide-23 11/09/2015    TDAP status: Up to date Flu Vaccine status: Up to date Pneumococcal vaccine status: Declined,  Education has been provided regarding the importance of this vaccine but patient still declined. Advised may receive this vaccine at local pharmacy or Health Dept. Aware to provide a copy of the vaccination record if obtained from local pharmacy or Health Dept. Verbalized acceptance and understanding.  Covid-19 vaccine status: Completed vaccines  Qualifies for Shingles Vaccine? Yes   Zostavax completed No   Shingrix Completed?: No.    Education has been provided regarding the importance of this vaccine. Patient has been advised to call insurance company to determine out of pocket expense if they have not yet received this vaccine. Advised may also receive vaccine at local pharmacy or Health Dept. Verbalized acceptance  and understanding.  Screening Tests Health Maintenance  Topic Date Due  . OPHTHALMOLOGY EXAM  Never done  . FOOT EXAM  06/26/2020  . PNA vac Low Risk Adult (1 of 2 - PCV13) 07/11/2021 (Originally 04/20/2019)  . HEMOGLOBIN A1C  10/12/2020  . MAMMOGRAM  05/13/2022  . COLONOSCOPY  01/27/2024  . TETANUS/TDAP  04/30/2027  . INFLUENZA VACCINE  Completed  . DEXA SCAN  Completed  . COVID-19 Vaccine  Completed  . Hepatitis C Screening  Completed    Health Maintenance  Health Maintenance Due  Topic Date Due  . OPHTHALMOLOGY EXAM  Never done  . FOOT EXAM  06/26/2020    Colorectal cancer screening: Completed 01/26/2014. Repeat every 5 years Mammogram status: Completed 05/13/2020. Repeat every year Bone Density status: Completed 05/13/2020.   Lung Cancer Screening: (Low Dose CT Chest recommended if Age 25-80 years, 30 pack-year currently smoking OR have quit w/in 15years.) does not qualify.   Lung Cancer Screening Referral: no  Additional Screening:  Hepatitis C Screening: does qualify; Completed 10/15/2018  Vision Screening: Recommended annual ophthalmology exams for early detection of glaucoma and other disorders of the eye. Is the patient up to date with their annual eye exam?  Yes  Who is the provider or what is the name of the office in which the patient attends annual eye exams? [facility]  If pt is not established with a provider, would they like to be referred to a provider to establish care? No .   Dental Screening: Recommended annual dental exams for proper oral hygiene  Community Resource Referral / Chronic Care Management: CRR required this visit?  No   CCM required this visit?  No      Plan:     I have personally reviewed and noted the following in the patient's chart:   . Medical and social history . Use of alcohol, tobacco or illicit drugs  . Current medications and supplements . Functional ability and status . Nutritional status . Physical  activity . Advanced directives . List of other physicians . Hospitalizations, surgeries, and ER visits in previous 12 months . Vitals . Screenings to include cognitive, depression, and falls . Referrals and appointments  In addition, I have reviewed and discussed with patient certain preventive protocols, quality metrics, and best practice recommendations. A written personalized care plan for preventive services as well as general preventive health recommendations were provided to patient.     Barb Merinoickeah E Tatijana Bierly, LPN   16/1/096010/04/2020   Nurse Notes:

## 2020-07-11 NOTE — Patient Instructions (Signed)
Anne Woodard , Thank you for taking time to come for your Medicare Wellness Visit. I appreciate your ongoing commitment to your health goals. Please review the following plan we discussed and let me know if I can assist you in the future.   Screening recommendations/referrals: Colonoscopy: completed 01/26/2014 Mammogram: completed 05/13/2020 Bone Density: completed 05/13/2020 Recommended yearly ophthalmology/optometry visit for glaucoma screening and checkup Recommended yearly dental visit for hygiene and checkup  Vaccinations: Influenza vaccine: today Pneumococcal vaccine: decline at this time due to cost Tdap vaccine: completed 04/29/2017 Shingles vaccine: discussed   Covid-19: 01/24/2020, 12/29/2019  Advanced directives: Advance directive discussed with you today. I have provided a copy for you to complete at home and have notarized. Once this is complete please bring a copy in to our office so we can scan it into your chart.  Conditions/risks identified: none  Next appointment: Follow up in one year for your annual wellness visit    Preventive Care 65 Years and Older, Female Preventive care refers to lifestyle choices and visits with your health care provider that can promote health and wellness. What does preventive care include?  A yearly physical exam. This is also called an annual well check.  Dental exams once or twice a year.  Routine eye exams. Ask your health care provider how often you should have your eyes checked.  Personal lifestyle choices, including:  Daily care of your teeth and gums.  Regular physical activity.  Eating a healthy diet.  Avoiding tobacco and drug use.  Limiting alcohol use.  Practicing safe sex.  Taking low-dose aspirin every day.  Taking vitamin and mineral supplements as recommended by your health care provider. What happens during an annual well check? The services and screenings done by your health care provider during your annual well  check will depend on your age, overall health, lifestyle risk factors, and family history of disease. Counseling  Your health care provider may ask you questions about your:  Alcohol use.  Tobacco use.  Drug use.  Emotional well-being.  Home and relationship well-being.  Sexual activity.  Eating habits.  History of falls.  Memory and ability to understand (cognition).  Work and work Astronomer.  Reproductive health. Screening  You may have the following tests or measurements:  Height, weight, and BMI.  Blood pressure.  Lipid and cholesterol levels. These may be checked every 5 years, or more frequently if you are over 78 years old.  Skin check.  Lung cancer screening. You may have this screening every year starting at age 57 if you have a 30-pack-year history of smoking and currently smoke or have quit within the past 15 years.  Fecal occult blood test (FOBT) of the stool. You may have this test every year starting at age 43.  Flexible sigmoidoscopy or colonoscopy. You may have a sigmoidoscopy every 5 years or a colonoscopy every 10 years starting at age 28.  Hepatitis C blood test.  Hepatitis B blood test.  Sexually transmitted disease (STD) testing.  Diabetes screening. This is done by checking your blood sugar (glucose) after you have not eaten for a while (fasting). You may have this done every 1-3 years.  Bone density scan. This is done to screen for osteoporosis. You may have this done starting at age 14.  Mammogram. This may be done every 1-2 years. Talk to your health care provider about how often you should have regular mammograms. Talk with your health care provider about your test results, treatment options, and  if necessary, the need for more tests. Vaccines  Your health care provider may recommend certain vaccines, such as:  Influenza vaccine. This is recommended every year.  Tetanus, diphtheria, and acellular pertussis (Tdap, Td) vaccine. You  may need a Td booster every 10 years.  Zoster vaccine. You may need this after age 60.  Pneumococcal 13-valent conjugate (PCV13) vaccine. One dose is recommended after age 26.  Pneumococcal polysaccharide (PPSV23) vaccine. One dose is recommended after age 51. Talk to your health care provider about which screenings and vaccines you need and how often you need them. This information is not intended to replace advice given to you by your health care provider. Make sure you discuss any questions you have with your health care provider. Document Released: 10/18/2015 Document Revised: 06/10/2016 Document Reviewed: 07/23/2015 Elsevier Interactive Patient Education  2017 [place] Prevention in the Home Falls can cause injuries. They can happen to people of all ages. There are many things you can do to make your home safe and to help prevent falls. What can I do on the outside of my home?  Regularly fix the edges of walkways and driveways and fix any cracks.  Remove anything that might make you trip as you walk through a door, such as a raised step or threshold.  Trim any bushes or trees on the path to your home.  Use bright outdoor lighting.  Clear any walking paths of anything that might make someone trip, such as rocks or tools.  Regularly check to see if handrails are loose or broken. Make sure that both sides of any steps have handrails.  Any raised decks and porches should have guardrails on the edges.  Have any leaves, snow, or ice cleared regularly.  Use sand or salt on walking paths during winter.  Clean up any spills in your garage right away. This includes oil or grease spills. What can I do in the bathroom?  Use night lights.  Install grab bars by the toilet and in the tub and shower. Do not use towel bars as grab bars.  Use non-skid mats or decals in the tub or shower.  If you need to sit down in the shower, use a plastic, non-slip stool.  Keep the floor  dry. Clean up any water that spills on the floor as soon as it happens.  Remove soap buildup in the tub or shower regularly.  Attach bath mats securely with double-sided non-slip rug tape.  Do not have throw rugs and other things on the floor that can make you trip. What can I do in the bedroom?  Use night lights.  Make sure that you have a light by your bed that is easy to reach.  Do not use any sheets or blankets that are too big for your bed. They should not hang down onto the floor.  Have a firm chair that has side arms. You can use this for support while you get dressed.  Do not have throw rugs and other things on the floor that can make you trip. What can I do in the kitchen?  Clean up any spills right away.  Avoid walking on wet floors.  Keep items that you use a lot in easy-to-reach places.  If you need to reach something above you, use a strong step stool that has a grab bar.  Keep electrical cords out of the way.  Do not use floor polish or wax that makes floors slippery. If you  must use wax, use non-skid floor wax.  Do not have throw rugs and other things on the floor that can make you trip. What can I do with my stairs?  Do not leave any items on the stairs.  Make sure that there are handrails on both sides of the stairs and use them. Fix handrails that are broken or loose. Make sure that handrails are as [place] as the stairways.  Check any carpeting to make sure that it is firmly attached to the stairs. Fix any carpet that is loose or worn.  Avoid having throw rugs at the top or bottom of the stairs. If you do have throw rugs, attach them to the floor with carpet tape.  Make sure that you have a light switch at the top of the stairs and the bottom of the stairs. If you do not have them, ask someone to add them for you. What else can I do to help prevent falls?  Wear shoes that:  Do not have high heels.  Have rubber bottoms.  Are comfortable and fit you  well.  Are closed at the toe. Do not wear sandals.  If you use a stepladder:  Make sure that it is fully opened. Do not climb a closed stepladder.  Make sure that both sides of the stepladder are locked into place.  Ask someone to hold it for you, if possible.  Clearly mark and make sure that you can see:  Any grab bars or handrails.  First and last steps.  Where the edge of each step is.  Use tools that help you move around (mobility aids) if they are needed. These include:  Canes.  Walkers.  Scooters.  Crutches.  Turn on the lights when you go into a dark area. Replace any light bulbs as soon as they burn out.  Set up your furniture so you have a clear path. Avoid moving your furniture around.  If any of your floors are uneven, fix them.  If there are any pets around you, be aware of where they are.  Review your medicines with your doctor. Some medicines can make you feel dizzy. This can increase your chance of falling. Ask your doctor what other things that you can do to help prevent falls. This information is not intended to replace advice given to you by your health care provider. Make sure you discuss any questions you have with your health care provider. Document Released: 07/18/2009 Document Revised: 02/27/2016 Document Reviewed: 10/26/2014 Elsevier Interactive Patient Education  2017 [employer].

## 2020-07-11 NOTE — Addendum Note (Signed)
Addended by: Barb Merino on: 07/11/2020 03:28 PM   Modules accepted: Orders

## 2020-07-12 LAB — BMP8+EGFR
BUN/Creatinine Ratio: 14 (ref 12–28)
BUN: 11 mg/dL (ref 8–27)
CO2: 22 mmol/L (ref 20–29)
Calcium: 9.5 mg/dL (ref 8.7–10.3)
Chloride: 104 mmol/L (ref 96–106)
Creatinine, Ser: 0.76 mg/dL (ref 0.57–1.00)
GFR calc Af Amer: 95 mL/min/{1.73_m2} (ref >59)
GFR calc non Af Amer: 82 mL/min/{1.73_m2} (ref >59)
Glucose: 134 mg/dL — ABNORMAL HIGH (ref 65–99)
Potassium: 4.6 mmol/L (ref 3.5–5.2)
Sodium: 141 mmol/L (ref 134–144)

## 2020-07-12 LAB — CBC
Hematocrit: 44.4 % (ref 34.0–46.6)
Hemoglobin: 15 g/dL (ref 11.1–15.9)
MCH: 31.4 pg (ref 26.6–33.0)
MCHC: 33.8 g/dL (ref 31.5–35.7)
MCV: 93 fL (ref 79–97)
Platelets: 263 10*3/uL (ref 150–450)
RBC: 4.77 x10E6/uL (ref 3.77–5.28)
RDW: 11.8 % (ref 11.7–15.4)
WBC: 7.4 10*3/uL (ref 3.4–10.8)

## 2020-07-12 LAB — HEMOGLOBIN A1C
Est. average glucose Bld gHb Est-mCnc: 169 mg/dL
Hgb A1c MFr Bld: 7.5 % — ABNORMAL HIGH (ref 4.8–5.6)

## 2020-07-16 ENCOUNTER — Encounter: Payer: Self-pay | Admitting: Internal Medicine

## 2020-09-03 ENCOUNTER — Other Ambulatory Visit: Payer: Self-pay | Admitting: Internal Medicine

## 2020-09-04 NOTE — Telephone Encounter (Signed)
Clonazepam refill 

## 2020-09-14 ENCOUNTER — Encounter: Payer: Self-pay | Admitting: Internal Medicine

## 2020-09-17 ENCOUNTER — Other Ambulatory Visit: Payer: Self-pay

## 2020-09-17 ENCOUNTER — Ambulatory Visit: Payer: Medicare Other

## 2020-09-17 VITALS — BP 135/80 | HR 88 | Temp 98.4°F | Ht 62.0 in | Wt 199.2 lb

## 2020-09-17 DIAGNOSIS — E1165 Type 2 diabetes mellitus with hyperglycemia: Secondary | ICD-10-CM

## 2020-09-17 NOTE — Patient Instructions (Signed)
Semaglutide injection solution What is this medicine? SEMAGLUTIDE (Sem a GLOO tide) is used to improve blood sugar control in adults with type 2 diabetes. This medicine may be used with other diabetes medicines. This drug may also reduce the risk of heart attack or stroke if you have type 2 diabetes and risk factors for heart disease. This medicine may be used for other purposes; ask your health care provider or pharmacist if you have questions. COMMON BRAND NAME(S): OZEMPIC What should I tell my health care provider before I take this medicine? They need to know if you have any of these conditions:  endocrine tumors (MEN 2) or if someone in your family had these tumors  eye disease, vision problems  history of pancreatitis  kidney disease  stomach problems  thyroid cancer or if someone in your family had thyroid cancer  an unusual or allergic reaction to semaglutide, other medicines, foods, dyes, or preservatives  pregnant or trying to get pregnant  breast-feeding How should I use this medicine? This medicine is for injection under the skin of your upper leg (thigh), stomach area, or upper arm. It is given once every week (every 7 days). You will be taught how to prepare and give this medicine. Use exactly as directed. Take your medicine at regular intervals. Do not take it more often than directed. If you use this medicine with insulin, you should inject this medicine and the insulin separately. Do not mix them together. Do not give the injections right next to each other. Change (rotate) injection sites with each injection. It is important that you put your used needles and syringes in a special sharps container. Do not put them in a trash can. If you do not have a sharps container, call your pharmacist or healthcare provider to get one. A special MedGuide will be given to you by the pharmacist with each prescription and refill. Be sure to read this information carefully each  time. This drug comes with INSTRUCTIONS FOR USE. Ask your pharmacist for directions on how to use this drug. Read the information carefully. Talk to your pharmacist or health care provider if you have questions. Talk to your pediatrician regarding the use of this medicine in children. Special care may be needed. Overdosage: If you think you have taken too much of this medicine contact a poison control center or emergency room at once. NOTE: This medicine is only for you. Do not share this medicine with others. What if I miss a dose? If you miss a dose, take it as soon as you can within 5 days after the missed dose. Then take your next dose at your regular weekly time. If it has been longer than 5 days after the missed dose, do not take the missed dose. Take the next dose at your regular time. Do not take double or extra doses. If you have questions about a missed dose, contact your health care provider for advice. What may interact with this medicine?  other medicines for diabetes Many medications may cause changes in blood sugar, these include:  alcohol containing beverages  antiviral medicines for HIV or AIDS  aspirin and aspirin-like drugs  certain medicines for blood pressure, heart disease, irregular heart beat  chromium  diuretics  female hormones, such as estrogens or progestins, birth control pills  fenofibrate  gemfibrozil  isoniazid  lanreotide  female hormones or anabolic steroids  MAOIs like Carbex, Eldepryl, Marplan, Nardil, and Parnate  medicines for weight loss  medicines for   allergies, asthma, cold, or cough  medicines for depression, anxiety, or psychotic disturbances  niacin  nicotine  NSAIDs, medicines for pain and inflammation, like ibuprofen or naproxen  octreotide  pasireotide  pentamidine  phenytoin  probenecid  quinolone antibiotics such as ciprofloxacin, levofloxacin, ofloxacin  some herbal dietary supplements  steroid medicines  such as prednisone or cortisone  sulfamethoxazole; trimethoprim  thyroid hormones Some medications can hide the warning symptoms of low blood sugar (hypoglycemia). You may need to monitor your blood sugar more closely if you are taking one of these medications. These include:  beta-blockers, often used for high blood pressure or heart problems (examples include atenolol, metoprolol, propranolol)  clonidine  guanethidine  reserpine This list may not describe all possible interactions. Give your health care provider a list of all the medicines, herbs, non-prescription drugs, or dietary supplements you use. Also tell them if you smoke, drink alcohol, or use illegal drugs. Some items may interact with your medicine. What should I watch for while using this medicine? Visit your doctor or health care professional for regular checks on your progress. Drink plenty of fluids while taking this medicine. Check with your doctor or health care professional if you get an attack of severe diarrhea, nausea, and vomiting. The loss of too much body fluid can make it dangerous for you to take this medicine. A test called the HbA1C (A1C) will be monitored. This is a simple blood test. It measures your blood sugar control over the last 2 to 3 months. You will receive this test every 3 to 6 months. Learn how to check your blood sugar. Learn the symptoms of low and high blood sugar and how to manage them. Always carry a quick-source of sugar with you in case you have symptoms of low blood sugar. Examples include hard sugar candy or glucose tablets. Make sure others know that you can choke if you eat or drink when you develop serious symptoms of low blood sugar, such as seizures or unconsciousness. They must get medical help at once. Tell your doctor or health care professional if you have high blood sugar. You might need to change the dose of your medicine. If you are sick or exercising more than usual, you might need  to change the dose of your medicine. Do not skip meals. Ask your doctor or health care professional if you should avoid alcohol. Many nonprescription cough and cold products contain sugar or alcohol. These can affect blood sugar. Pens should never be shared. Even if the needle is changed, sharing may result in passing of viruses like hepatitis or HIV. Wear a medical ID bracelet or chain, and carry a card that describes your disease and details of your medicine and dosage times. Do not become pregnant while taking this medicine. Women should inform their doctor if they wish to become pregnant or think they might be pregnant. There is a potential for serious side effects to an unborn child. Talk to your health care professional or pharmacist for more information. What side effects may I notice from receiving this medicine? Side effects that you should report to your doctor or health care professional as soon as possible:  allergic reactions like skin rash, itching or hives, swelling of the face, lips, or tongue  breathing problems  changes in vision  diarrhea that continues or is severe  lump or swelling on the neck  severe nausea  signs and symptoms of infection like fever or chills; cough; sore throat; pain or trouble   passing urine  signs and symptoms of low blood sugar such as feeling anxious, confusion, dizziness, increased hunger, unusually weak or tired, sweating, shakiness, cold, irritable, headache, blurred vision, fast heartbeat, loss of consciousness  signs and symptoms of kidney injury like trouble passing urine or change in the amount of urine  trouble swallowing  unusual stomach upset or pain  vomiting Side effects that usually do not require medical attention (report to your doctor or health care professional if they continue or are bothersome):  constipation  diarrhea  nausea  pain, redness, or irritation at site where injected  stomach upset This list may not  describe all possible side effects. Call your doctor for medical advice about side effects. You may report side effects to FDA at 1-800-FDA-1088. Where should I keep my medicine? Keep out of the reach of children. Store unopened pens in a refrigerator between 2 and 8 degrees C (36 and 46 degrees F). Do not freeze. Protect from light and heat. After you first use the pen, it can be stored for 56 days at room temperature between 15 and 30 degrees C (59 and 86 degrees F) or in a refrigerator. Throw away your used pen after 56 days or after the expiration date, whichever comes first. Do not store your pen with the needle attached. If the needle is left on, medicine may leak from the pen. NOTE: This sheet is a summary. It may not cover all possible information. If you have questions about this medicine, talk to your doctor, pharmacist, or health care provider.  2020 Elsevier/Gold Standard (2019-06-06 09:41:51)  

## 2020-10-02 ENCOUNTER — Encounter: Payer: Self-pay | Admitting: Internal Medicine

## 2020-10-31 ENCOUNTER — Telehealth: Payer: Self-pay

## 2020-10-31 ENCOUNTER — Other Ambulatory Visit: Payer: Self-pay

## 2020-10-31 ENCOUNTER — Encounter: Payer: Self-pay | Admitting: Internal Medicine

## 2020-10-31 MED ORDER — OZEMPIC (0.25 OR 0.5 MG/DOSE) 2 MG/1.5ML ~~LOC~~ SOPN: 0.5000 mg | PEN_INJECTOR | SUBCUTANEOUS | 0 refills | Status: DC

## 2020-10-31 NOTE — Telephone Encounter (Signed)
Refill medication

## 2020-11-04 ENCOUNTER — Encounter: Payer: Self-pay | Admitting: Internal Medicine

## 2020-11-06 ENCOUNTER — Encounter: Payer: Self-pay | Admitting: Internal Medicine

## 2020-11-07 ENCOUNTER — Ambulatory Visit: Payer: Medicare Other | Admitting: Internal Medicine

## 2020-11-20 ENCOUNTER — Other Ambulatory Visit: Payer: Self-pay | Admitting: Internal Medicine

## 2020-11-20 NOTE — Telephone Encounter (Signed)
Clonopin refill

## 2020-11-22 ENCOUNTER — Ambulatory Visit: Payer: Medicare Other | Admitting: Nurse Practitioner

## 2020-11-27 ENCOUNTER — Ambulatory Visit: Payer: Medicare Other | Admitting: Internal Medicine

## 2020-12-06 ENCOUNTER — Other Ambulatory Visit: Payer: Self-pay

## 2020-12-06 ENCOUNTER — Ambulatory Visit (INDEPENDENT_AMBULATORY_CARE_PROVIDER_SITE_OTHER): Payer: Medicare Other | Admitting: Nurse Practitioner

## 2020-12-06 ENCOUNTER — Encounter: Payer: Self-pay | Admitting: Nurse Practitioner

## 2020-12-06 VITALS — BP 134/70 | HR 69 | Temp 98.7°F | Ht 61.6 in | Wt 193.8 lb

## 2020-12-06 DIAGNOSIS — E1159 Type 2 diabetes mellitus with other circulatory complications: Secondary | ICD-10-CM | POA: Diagnosis not present

## 2020-12-06 DIAGNOSIS — E1165 Type 2 diabetes mellitus with hyperglycemia: Secondary | ICD-10-CM | POA: Diagnosis not present

## 2020-12-06 DIAGNOSIS — I152 Hypertension secondary to endocrine disorders: Secondary | ICD-10-CM

## 2020-12-06 DIAGNOSIS — E1169 Type 2 diabetes mellitus with other specified complication: Secondary | ICD-10-CM

## 2020-12-06 DIAGNOSIS — E669 Obesity, unspecified: Secondary | ICD-10-CM

## 2020-12-06 DIAGNOSIS — E7800 Pure hypercholesterolemia, unspecified: Secondary | ICD-10-CM

## 2020-12-06 DIAGNOSIS — Z6835 Body mass index (BMI) 35.0-35.9, adult: Secondary | ICD-10-CM

## 2020-12-06 NOTE — Patient Instructions (Signed)
Diabetes Mellitus and Exercise Exercising regularly is important for overall health, especially for people who have diabetes mellitus. Exercising is not only about losing weight. It has many other health benefits, such as increasing muscle strength and bone density and reducing body fat and stress. This leads to improved fitness, flexibility, and endurance, all of which result in better overall health. What are the benefits of exercise if I have diabetes? Exercise has many benefits for people with diabetes. They include:  Helping to lower and control blood sugar (glucose).  Helping the body to respond better to the hormone insulin by improving insulin sensitivity.  Reducing how much insulin the body needs.  Lowering the risk for heart disease by: ? Lowering "bad" cholesterol and triglyceride levels. ? Increasing "good" cholesterol levels. ? Lowering blood pressure. ? Lowering blood glucose levels. What is my activity plan? Your health care provider or certified diabetes educator can help you make a plan for the type and frequency of exercise that works for you. This is called your activity plan. Be sure to:  Get at least 150 minutes of medium-intensity or high-intensity exercise each week. Exercises may include brisk walking, biking, or water aerobics.  Do stretching and strengthening exercises, such as yoga or weight lifting, at least 2 times a week.  Spread out your activity over at least 3 days of the week.  Get some form of physical activity each day. ? Do not go more than 2 days in a row without some kind of physical activity. ? Avoid being inactive for more than 90 minutes at a time. Take frequent breaks to walk or stretch.  Choose exercises or activities that you enjoy. Set realistic goals.  Start slowly and gradually increase your exercise intensity over time.   How do I manage my diabetes during exercise? Monitor your blood glucose  Check your blood glucose before and  after exercising. If your blood glucose is: ? 240 mg/dL (13.3 mmol/L) or higher before you exercise, check your urine for ketones. These are chemicals created by the liver. If you have ketones in your urine, do not exercise until your blood glucose returns to normal. ? 100 mg/dL (5.6 mmol/L) or lower, eat a snack containing 15-20 grams of carbohydrate. Check your blood glucose 15 minutes after the snack to make sure that your glucose level is above 100 mg/dL (5.6 mmol/L) before you start your exercise.  Know the symptoms of low blood glucose (hypoglycemia) and how to treat it. Your risk for hypoglycemia increases during and after exercise. Follow these tips and your health care provider's instructions  Keep a carbohydrate snack that is fast-acting for use before, during, and after exercise to help prevent or treat hypoglycemia.  Avoid injecting insulin into areas of the body that are going to be exercised. For example, avoid injecting insulin into: ? Your arms, when you are about to play tennis. ? Your legs, when you are about to go jogging.  Keep records of your exercise habits. Doing this can help you and your health care provider adjust your diabetes management plan as needed. Write down: ? Food that you eat before and after you exercise. ? Blood glucose levels before and after you exercise. ? The type and amount of exercise you have done.  Work with your health care provider when you start a new exercise or activity. He or she may need to: ? Make sure that the activity is safe for you. ? Adjust your insulin, other medicines, and food that   you eat.  Drink plenty of water while you exercise. This prevents loss of water (dehydration) and problems caused by a lot of heat in the body (heat stroke).   Where to find more information  American Diabetes Association: www.diabetes.org Summary  Exercising regularly is important for overall health, especially for people who have diabetes  mellitus.  Exercising has many health benefits. It increases muscle strength and bone density and reduces body fat and stress. It also lowers and controls blood glucose.  Your health care provider or certified diabetes educator can help you make an activity plan for the type and frequency of exercise that works for you.  Work with your health care provider to make sure any new activity is safe for you. Also work with your health care provider to adjust your insulin, other medicines, and the food you eat. This information is not intended to replace advice given to you by your health care provider. Make sure you discuss any questions you have with your health care provider. Document Revised: 06/19/2019 Document Reviewed: 06/19/2019 Elsevier Patient Education  2021 Elsevier Inc.  

## 2020-12-06 NOTE — Progress Notes (Signed)
I,Tianna Badgett,acting as a Education administrator for [employer], NP.,have documented all relevant documentation on the behalf of [employer], NP,as directed by  Bary Castilla, NP while in the presence of Bary Castilla, NP.  This visit occurred during the SARS-CoV-2 public health emergency.  Safety protocols were in place, including screening questions prior to the visit, additional usage of staff PPE, and extensive cleaning of exam room while observing appropriate contact time as indicated for disinfecting solutions.  Subjective:     Patient ID: Anne Woodard , female    DOB: 1956-02-26 , 65 y.o.   MRN: [id number]   Chief Complaint  Patient presents with  . Diabetes    HPI  The patient is here today for a follow-up on her diabetes and blood pressure. She reports compliance with meds. She states that she is getting more exercise. She has stopped her Ozempic due to side effects. She has lost 6 pounds on diet and exercise. She runs around 120s-130s.   Wt Readings from Last 3 Encounters: 12/06/20 : 193 lb 12.8 oz (87.9 kg) 09/17/20 : 199 lb 3.2 oz (90.4 kg) 07/11/20 : 194 lb (88 kg)  Diet: she has cut back on snacking. She eats more protein.  Exercise: She has started to do more walking.   Diabetes She presents for her follow-up diabetic visit. She has type 2 diabetes mellitus. Her disease course has been stable. There are no hypoglycemic associated symptoms. Pertinent negatives for diabetes include no blurred vision, no chest pain, no fatigue, no polydipsia, no polyphagia, no polyuria and no weakness. There are no hypoglycemic complications. Symptoms are worsening. Risk factors for coronary artery disease include diabetes mellitus, hypertension, obesity, sedentary lifestyle and post-menopausal. Her breakfast blood glucose range is generally 110-130 mg/dl. Eye exam is not current.  Hypertension This is a chronic problem. The current episode started more than 1 year ago. The problem  has been gradually improving since onset. The problem is controlled. Pertinent negatives include no blurred vision, chest pain, palpitations or shortness of breath. Risk factors for coronary artery disease include diabetes mellitus, dyslipidemia, obesity, sedentary lifestyle and post-menopausal state. The current treatment provides moderate improvement. Compliance problems include exercise.      Past Medical History:  Diagnosis Date  . Abnormal EKG 01/14/2018  . Depression   . Diabetes mellitus type 2 in obese ([place]) 01/14/2018  . Pure hypercholesterolemia 01/14/2018     Family History  Problem Relation Age of Onset  . Colon cancer Mother   . Endometrial cancer Mother   . Hypertension Mother   . Alzheimer's disease Father   . Multiple sclerosis Son   . Hypertension Son   . Depression Son   . Anxiety disorder Son   . Breast cancer Neg Hx      Current Outpatient Medications:  .  Ascorbic Acid (VITAMIN C PO), Take by mouth. Take one tablet daily, Disp: , Rfl:  .  clonazePAM (KLONOPIN) 1 MG tablet, TAKE 1/2 TABLET BY MOUTH TWICE DAILY AS NEEDED, Disp: 30 tablet, Rfl: 0 .  doxylamine, Sleep, (UNISOM) 25 MG tablet, Take 25 mg by mouth at bedtime as needed., Disp: , Rfl:  .  escitalopram (LEXAPRO) 10 MG tablet, Take 1 tablet (10 mg total) by mouth daily., Disp: 90 tablet, Rfl: 2 .  glucose blood (ONETOUCH VERIO) test strip, Use as directed to check blood sugars daily dx: e11.69, Disp: 100 each, Rfl: 2 .  losartan (COZAAR) 25 MG tablet, Take 1 tablet (25 mg total) by mouth  daily., Disp: 90 tablet, Rfl: 2 .  Multiple Vitamin (MULTIVITAMIN) tablet, Take 1 tablet by mouth daily., Disp: , Rfl:  .  OneTouch Delica Lancets 37S MISC, Use as directed to check blood sugars daily dx: e11.69, Disp: 100 each, Rfl: 2 .  rosuvastatin (CRESTOR) 10 MG tablet, Take 1 tablet (10 mg total) by mouth daily., Disp: 90 tablet, Rfl: 1 .  Vitamin D, Ergocalciferol, 50 MCG (2000 UT) CAPS, Take 2,000 Units by mouth  daily. Every once and a while, Disp: , Rfl:    No Known Allergies   Review of Systems  Constitutional: Negative.  Negative for chills, fatigue and fever.  Eyes: Negative for blurred vision.  Respiratory: Negative for chest tightness, shortness of breath and wheezing.   Cardiovascular: Negative.  Negative for chest pain and palpitations.  Gastrointestinal: Negative.  Negative for constipation and diarrhea.  Endocrine: Negative for polydipsia, polyphagia and polyuria.  Musculoskeletal: Negative for arthralgias and myalgias.  Neurological: Negative.  Negative for weakness.     Today's Vitals   12/06/20 1100  BP: 134/70  Pulse: 69  Temp: 98.7 F (37.1 C)  TempSrc: Oral  Weight: 193 lb 12.8 oz (87.9 kg)  Height: 5' 1.6" (1.565 m)   Body mass index is 35.91 kg/m.  Wt Readings from Last 3 Encounters:  12/06/20 193 lb 12.8 oz (87.9 kg)  09/17/20 199 lb 3.2 oz (90.4 kg)  07/11/20 194 lb (88 kg)    Objective:  Physical Exam Constitutional:      Appearance: Normal appearance. She is obese.  HENT:     Head: Normocephalic and atraumatic.  Cardiovascular:     Rate and Rhythm: Normal rate and regular rhythm.     Pulses: Normal pulses.     Heart sounds: Normal heart sounds. No murmur heard.   Pulmonary:     Effort: Pulmonary effort is normal. No respiratory distress.     Breath sounds: Normal breath sounds. No wheezing.  Skin:    General: Skin is warm and dry.     Capillary Refill: Capillary refill takes less than 2 seconds.  Neurological:     Mental Status: She is alert and oriented to person, place, and time.  Psychiatric:        Mood and Affect: Mood normal.        Behavior: Behavior normal.        Thought Content: Thought content normal.        Judgment: Judgment normal.        Assessment And Plan:     1. Uncontrolled type 2 diabetes mellitus with hyperglycemia (HCC) -Stable -Currently controlled by diet and exercise -Will check lab results  - Hemoglobin A1c -  CMP14+EGFR  2. Pure hypercholesterolemia -Chronic  -will check lipid panel  -Continue taking crestor 10 mg  - Lipid panel  3. Obesity, diabetes, and hypertension syndrome (HCC) -BP controlled.will check labs. Advised to aim for atleast 150 min of exercise per week. -Educated patient about healthy diet incorporating fruits, vegetables and low sugar food and drinks. Low sodium, low carb and low fat.  - CMP14+EGFR  4. Class 2 severe obesity due to excess calories with serious comorbidity and body mass index (BMI) of 35.0 to 35.9 in adult [facility])  She is encouraged to strive for BMI less than 30 to decrease cardiac risk. Advised to aim for at least 150 minutes of exercise per week.   Follow up in 3 months   Patient was given opportunity to ask questions. Patient verbalized  understanding of the plan and was able to repeat key elements of the plan. All questions were answered to their satisfaction.  Bary Castilla, NP   I, Bary Castilla, NP, have reviewed all documentation for this visit. The documentation on 12/06/20 for the exam, diagnosis, procedures, and orders are all accurate and complete.  THE PATIENT IS ENCOURAGED TO PRACTICE SOCIAL DISTANCING DUE TO THE COVID-19 PANDEMIC.

## 2020-12-07 LAB — CMP14+EGFR
ALT: 36 IU/L — ABNORMAL HIGH (ref 0–32)
AST: 35 IU/L (ref 0–40)
Albumin/Globulin Ratio: 1.4 (ref 1.2–2.2)
Albumin: 4.2 g/dL (ref 3.8–4.8)
Alkaline Phosphatase: 103 IU/L (ref 44–121)
BUN/Creatinine Ratio: 16 (ref 12–28)
BUN: 11 mg/dL (ref 8–27)
Bilirubin Total: 0.3 mg/dL (ref 0.0–1.2)
CO2: 20 mmol/L (ref 20–29)
Calcium: 9 mg/dL (ref 8.7–10.3)
Chloride: 104 mmol/L (ref 96–106)
Creatinine, Ser: 0.67 mg/dL (ref 0.57–1.00)
Globulin, Total: 2.9 g/dL (ref 1.5–4.5)
Glucose: 179 mg/dL — ABNORMAL HIGH (ref 65–99)
Potassium: 4.5 mmol/L (ref 3.5–5.2)
Sodium: 139 mmol/L (ref 134–144)
Total Protein: 7.1 g/dL (ref 6.0–8.5)
eGFR: 96 mL/min/{1.73_m2} (ref >59)

## 2020-12-07 LAB — LIPID PANEL
Chol/HDL Ratio: 2.9 ratio (ref 0.0–4.4)
Cholesterol, Total: 127 mg/dL (ref 100–199)
HDL: 44 mg/dL (ref >39)
LDL Chol Calc (NIH): 64 mg/dL (ref 0–99)
Triglycerides: 100 mg/dL (ref 0–149)
VLDL Cholesterol Cal: 19 mg/dL (ref 5–40)

## 2020-12-07 LAB — HEMOGLOBIN A1C
Est. average glucose Bld gHb Est-mCnc: 157 mg/dL
Hgb A1c MFr Bld: 7.1 % — ABNORMAL HIGH (ref 4.8–5.6)

## 2020-12-13 ENCOUNTER — Telehealth: Payer: Self-pay

## 2020-12-13 NOTE — Telephone Encounter (Signed)
I left the patient a message to call for her lab results or view them on her mychart

## 2020-12-13 NOTE — Telephone Encounter (Signed)
-----   Message from Charlesetta Ivory, NP sent at 12/11/2020  2:21 PM EST ----- Your lipid panel is within normal range. Your Hgb A1c is better than what it was 5 months ago. It is at 7.1 which is still in the diabetic range. Are you willing to try metformin? Continue to focus on a diabetic diet. Eat less carbs and increase intake of sugar free foods and drinks. Increase your physical activity.  Your kidney and liver function are stable. Make sure you are drinking plenty of water. Let me know if you have any questions.

## 2021-01-08 ENCOUNTER — Other Ambulatory Visit: Payer: Self-pay | Admitting: Internal Medicine

## 2021-01-08 NOTE — Telephone Encounter (Signed)
Clonazepam refill 

## 2021-01-30 ENCOUNTER — Encounter: Payer: Self-pay | Admitting: Internal Medicine

## 2021-02-28 DIAGNOSIS — E119 Type 2 diabetes mellitus without complications: Secondary | ICD-10-CM | POA: Diagnosis not present

## 2021-02-28 LAB — HM DIABETES EYE EXAM

## 2021-03-07 ENCOUNTER — Encounter: Payer: Self-pay | Admitting: Internal Medicine

## 2021-03-10 ENCOUNTER — Other Ambulatory Visit: Payer: Self-pay

## 2021-03-10 ENCOUNTER — Encounter: Payer: Self-pay | Admitting: Internal Medicine

## 2021-03-10 ENCOUNTER — Ambulatory Visit (INDEPENDENT_AMBULATORY_CARE_PROVIDER_SITE_OTHER): Payer: Medicare Other | Admitting: Internal Medicine

## 2021-03-10 VITALS — BP 112/70 | HR 76 | Temp 98.6°F | Ht 61.6 in | Wt 196.6 lb

## 2021-03-10 DIAGNOSIS — Z23 Encounter for immunization: Secondary | ICD-10-CM | POA: Diagnosis not present

## 2021-03-10 DIAGNOSIS — Z800 Family history of malignant neoplasm of digestive organs: Secondary | ICD-10-CM | POA: Diagnosis not present

## 2021-03-10 DIAGNOSIS — E7800 Pure hypercholesterolemia, unspecified: Secondary | ICD-10-CM | POA: Diagnosis not present

## 2021-03-10 DIAGNOSIS — E1165 Type 2 diabetes mellitus with hyperglycemia: Secondary | ICD-10-CM | POA: Diagnosis not present

## 2021-03-10 DIAGNOSIS — Z6836 Body mass index (BMI) 36.0-36.9, adult: Secondary | ICD-10-CM

## 2021-03-10 DIAGNOSIS — F419 Anxiety disorder, unspecified: Secondary | ICD-10-CM | POA: Diagnosis not present

## 2021-03-10 DIAGNOSIS — E66812 Obesity, class 2: Secondary | ICD-10-CM

## 2021-03-10 MED ORDER — SHINGRIX 50 MCG/0.5ML IM SUSR: 0.5000 mL | Freq: Once | INTRAMUSCULAR | 0 refills | Status: AC

## 2021-03-10 NOTE — Patient Instructions (Signed)
Diabetes Mellitus and Foot Care Foot care is an important part of your health, especially when you have diabetes. Diabetes may cause you to have problems because of poor blood flow (circulation) to your feet and legs, which can cause your skin to:  Become thinner and drier.  Break more easily.  Heal more slowly.  Peel and crack. You may also have nerve damage (neuropathy) in your legs and feet, causing decreased feeling in them. This means that you may not notice minor injuries to your feet that could lead to more serious problems. Noticing and addressing any potential problems early is the best way to prevent future foot problems. How to care for your feet Foot hygiene  Wash your feet daily with warm water and mild soap. Do not use hot water. Then, pat your feet and the areas between your toes until they are completely dry. Do not soak your feet as this can dry your skin.  Trim your toenails straight across. Do not dig under them or around the cuticle. File the edges of your nails with an emery board or nail file.  Apply a moisturizing lotion or petroleum jelly to the skin on your feet and to dry, brittle toenails. Use lotion that does not contain alcohol and is unscented. Do not apply lotion between your toes.   Shoes and socks  Wear clean socks or stockings every day. Make sure they are not too tight. Do not wear knee-high stockings since they may decrease blood flow to your legs.  Wear shoes that fit properly and have enough cushioning. Always look in your shoes before you put them on to be sure there are no objects inside.  To break in new shoes, wear them for just a few hours a day. This prevents injuries on your feet. Wounds, scrapes, corns, and calluses  Check your feet daily for blisters, cuts, bruises, sores, and redness. If you cannot see the bottom of your feet, use a mirror or ask someone for help.  Do not cut corns or calluses or try to remove them with medicine.  If you  find a minor scrape, cut, or break in the skin on your feet, keep it and the skin around it clean and dry. You may clean these areas with mild soap and water. Do not clean the area with peroxide, alcohol, or iodine.  If you have a wound, scrape, corn, or callus on your foot, look at it several times a day to make sure it is healing and not infected. Check for: ? Redness, swelling, or pain. ? Fluid or blood. ? Warmth. ? Pus or a bad smell.   General tips  Do not cross your legs. This may decrease blood flow to your feet.  Do not use heating pads or hot water bottles on your feet. They may burn your skin. If you have lost feeling in your feet or legs, you may not know this is happening until it is too late.  Protect your feet from hot and cold by wearing shoes, such as at the beach or on hot pavement.  Schedule a complete foot exam at least once a year (annually) or more often if you have foot problems. Report any cuts, sores, or bruises to your health care provider immediately. Where to find more information  American Diabetes Association: www.diabetes.org  Association of Diabetes Care & Education Specialists: www.diabeteseducator.org Contact a health care provider if:  You have a medical condition that increases your risk of infection and   you have any cuts, sores, or bruises on your feet.  You have an injury that is not healing.  You have redness on your legs or feet.  You feel burning or tingling in your legs or feet.  You have pain or cramps in your legs and feet.  Your legs or feet are numb.  Your feet always feel cold.  You have pain around any toenails. Get help right away if:  You have a wound, scrape, corn, or callus on your foot and: ? You have pain, swelling, or redness that gets worse. ? You have fluid or blood coming from the wound, scrape, corn, or callus. ? Your wound, scrape, corn, or callus feels warm to the touch. ? You have pus or a bad smell coming from  the wound, scrape, corn, or callus. ? You have a fever. ? You have a red line going up your leg. Summary  Check your feet every day for blisters, cuts, bruises, sores, and redness.  Apply a moisturizing lotion or petroleum jelly to the skin on your feet and to dry, brittle toenails.  Wear shoes that fit properly and have enough cushioning.  If you have foot problems, report any cuts, sores, or bruises to your health care provider immediately.  Schedule a complete foot exam at least once a year (annually) or more often if you have foot problems. This information is not intended to replace advice given to you by your health care provider. Make sure you discuss any questions you have with your health care provider. Document Revised: 04/11/2020 Document Reviewed: 04/11/2020 Elsevier Patient Education  2021 Elsevier Inc.  

## 2021-03-10 NOTE — Progress Notes (Signed)
I,Katawbba Wiggins,acting as a Education administrator for [name], MD.,have documented all relevant documentation on the behalf of [name], MD,as directed by  [name], MD while in the presence of [name], MD.  This visit occurred during the SARS-CoV-2 public health emergency.  Safety protocols were in place, including screening questions prior to the visit, additional usage of staff PPE, and extensive cleaning of exam room while observing appropriate contact time as indicated for disinfecting solutions.  Subjective:     Patient ID: Anne Woodard , female    DOB: 1956-05-26 , 65 y.o.   MRN: [id number]   Chief Complaint  Patient presents with   Diabetes   Obesity    HPI  The patient is here today for a follow-up on her diabetes and and weight.  She reports compiance with meds. She reports having upcoming cataract surgery on July 18th and August 1st.   Diabetes She presents for her follow-up diabetic visit. She has type 2 diabetes mellitus. Her disease course has been stable. There are no hypoglycemic associated symptoms. Pertinent negatives for diabetes include no blurred vision, no chest pain, no fatigue, no polydipsia, no polyphagia, no polyuria and no weakness. There are no hypoglycemic complications. Symptoms are worsening. Risk factors for coronary artery disease include diabetes mellitus, hypertension, obesity, sedentary lifestyle and post-menopausal. Her breakfast blood glucose range is generally 110-130 mg/dl. Eye exam is not current.  Hypertension This is a chronic problem. The current episode started more than 1 year ago. The problem has been gradually improving since onset. The problem is controlled. Pertinent negatives include no blurred vision, chest pain, palpitations or shortness of breath. Risk factors for coronary artery disease include diabetes mellitus, dyslipidemia, obesity, sedentary lifestyle and post-menopausal state. The current treatment provides moderate  improvement. Compliance problems include exercise.     Past Medical History:  Diagnosis Date   Abnormal EKG 01/14/2018   Depression    Diabetes mellitus type 2 in obese ([place]) 01/14/2018   Pure hypercholesterolemia 01/14/2018     Family History  Problem Relation Age of Onset   Colon cancer Mother    Endometrial cancer Mother    Hypertension Mother    Alzheimer's disease Father    Multiple sclerosis Son    Hypertension Son    Depression Son    Anxiety disorder Son    Breast cancer Neg Hx      Current Outpatient Medications:    Ascorbic Acid (VITAMIN C PO), Take by mouth. Take one tablet daily, Disp: , Rfl:    doxylamine, Sleep, (UNISOM) 25 MG tablet, Take 25 mg by mouth at bedtime as needed., Disp: , Rfl:    escitalopram (LEXAPRO) 10 MG tablet, Take 1 tablet (10 mg total) by mouth daily., Disp: 90 tablet, Rfl: 2   glucose blood (ONETOUCH VERIO) test strip, Use as directed to check blood sugars daily dx: e11.69, Disp: 100 each, Rfl: 2   losartan (COZAAR) 25 MG tablet, Take 1 tablet (25 mg total) by mouth daily., Disp: 90 tablet, Rfl: 2   Multiple Vitamin (MULTIVITAMIN) tablet, Take 1 tablet by mouth daily., Disp: , Rfl:    OneTouch Delica Lancets 38B MISC, Use as directed to check blood sugars daily dx: e11.69, Disp: 100 each, Rfl: 2   rosuvastatin (CRESTOR) 10 MG tablet, TAKE 1 TABLET BY MOUTH EVERY DAY, Disp: 90 tablet, Rfl: 1   Vitamin D, Ergocalciferol, 50 MCG (2000 UT) CAPS, Take 2,000 Units by mouth daily. Every once and a  while, Disp: , Rfl:    clonazePAM (KLONOPIN) 1 MG tablet, TAKE 1/2 TABLET BY MOUTH TWICE DAILY AS NEEDED, Disp: 30 tablet, Rfl: 0   No Known Allergies   Review of Systems  Constitutional: Negative.  Negative for fatigue.  Eyes:  Negative for blurred vision.  Respiratory: Negative.  Negative for shortness of breath.   Cardiovascular: Negative.  Negative for chest pain and palpitations.  Gastrointestinal: Negative.   Endocrine: Negative for polydipsia,  polyphagia and polyuria.  Neurological:  Negative for weakness.  Psychiatric/Behavioral: Negative.    All other systems reviewed and are negative.   Today's Vitals   03/10/21 1409  BP: 112/70  Pulse: 76  Temp: 98.6 F (37 C)  TempSrc: Oral  Weight: 196 lb 9.6 oz (89.2 kg)  Height: 5' 1.6" (1.565 m)  PainSc: 1    Body mass index is 36.43 kg/m.  Wt Readings from Last 3 Encounters:  03/10/21 196 lb 9.6 oz (89.2 kg)  12/06/20 193 lb 12.8 oz (87.9 kg)  09/17/20 199 lb 3.2 oz (90.4 kg)   BP Readings from Last 3 Encounters:  03/10/21 112/70  12/06/20 134/70  09/17/20 135/80   Objective:  Physical Exam Vitals and nursing note reviewed.  Constitutional:      Appearance: Normal appearance.  HENT:     Head: Normocephalic and atraumatic.     Nose:     Comments: Masked     Mouth/Throat:     Comments: Masked  Cardiovascular:     Rate and Rhythm: Normal rate and regular rhythm.     Heart sounds: Normal heart sounds.  Pulmonary:     Effort: Pulmonary effort is normal.     Breath sounds: Normal breath sounds.  Skin:    General: Skin is warm.  Neurological:     General: No focal deficit present.     Mental Status: She is alert.  Psychiatric:        Mood and Affect: Mood normal.        Behavior: Behavior normal.        Assessment And Plan:     1. Uncontrolled type 2 diabetes mellitus with hyperglycemia (HCC) Comments: Chronic, I will check labs as listed below. Importance of dietary/exercise compliance was discused with the patient.  - BMP8+EGFR - Hemoglobin A1c  2. Class 2 severe obesity due to excess calories with serious comorbidity and body mass index (BMI) of 36.0 to 36.9 in adult [id number]) Comments: She has gained 3 lbs since March 2022. She is encouraged to incorporate more exercise into her daily routine.   3. Pure hypercholesterolemia Comments: LDL March 2022, 64 - this is at goal. She will c/w rosuvastatin $RemoveBeforeDE'10mg'LLRRcKnXKHGjGXv$  daily.   4. Anxiety Comments: Chronic, she will  continue with escitalopram.   5. Immunization due Comments: I will send rx Shingrix to her local pharmacy.   6. Family history of colon cancer Comments: Last colonoscopy on file was in 2015. I will refer her to GI for CRC screening, she was duein 2020.  - Ambulatory referral to Gastroenterology  Patient was given opportunity to ask questions. Patient verbalized understanding of the plan and was able to repeat key elements of the plan. All questions were answered to their satisfaction.   I, [name], MD, have reviewed all documentation for this visit. The documentation on 03/22/21 for the exam, diagnosis, procedures, and orders are all accurate and complete.   IF YOU HAVE BEEN REFERRED TO A SPECIALIST, IT MAY TAKE 1-2 WEEKS TO  SCHEDULE/PROCESS THE REFERRAL. IF YOU HAVE NOT HEARD FROM US/SPECIALIST IN TWO WEEKS, PLEASE GIVE [place] A CALL AT [phone].   THE PATIENT IS ENCOURAGED TO PRACTICE SOCIAL DISTANCING DUE TO THE COVID-19 PANDEMIC.

## 2021-03-11 LAB — BMP8+EGFR
BUN/Creatinine Ratio: 20 (ref 12–28)
BUN: 13 mg/dL (ref 8–27)
CO2: 22 mmol/L (ref 20–29)
Calcium: 9.9 mg/dL (ref 8.7–10.3)
Chloride: 104 mmol/L (ref 96–106)
Creatinine, Ser: 0.64 mg/dL (ref 0.57–1.00)
Glucose: 146 mg/dL — ABNORMAL HIGH (ref 65–99)
Potassium: 4.4 mmol/L (ref 3.5–5.2)
Sodium: 141 mmol/L (ref 134–144)
eGFR: 97 mL/min/{1.73_m2} (ref >59)

## 2021-03-11 LAB — HEMOGLOBIN A1C
Est. average glucose Bld gHb Est-mCnc: 186 mg/dL
Hgb A1c MFr Bld: 8.1 % — ABNORMAL HIGH (ref 4.8–5.6)

## 2021-03-12 ENCOUNTER — Other Ambulatory Visit: Payer: Self-pay | Admitting: Internal Medicine

## 2021-03-12 ENCOUNTER — Encounter: Payer: Self-pay | Admitting: Internal Medicine

## 2021-03-18 ENCOUNTER — Telehealth: Payer: Self-pay

## 2021-03-18 NOTE — Telephone Encounter (Signed)
The pt was told that I was calling to schedule her an appt for a 4 wks f/u on the farxiga. The pt was will schedule the appt when she picks up her samples.

## 2021-04-01 DIAGNOSIS — H2511 Age-related nuclear cataract, right eye: Secondary | ICD-10-CM | POA: Diagnosis not present

## 2021-04-08 ENCOUNTER — Encounter: Payer: Self-pay | Admitting: Ophthalmology

## 2021-04-14 ENCOUNTER — Encounter: Payer: Self-pay | Admitting: Internal Medicine

## 2021-04-14 ENCOUNTER — Ambulatory Visit (INDEPENDENT_AMBULATORY_CARE_PROVIDER_SITE_OTHER): Payer: Medicare Other | Admitting: Internal Medicine

## 2021-04-14 ENCOUNTER — Other Ambulatory Visit: Payer: Self-pay

## 2021-04-14 VITALS — BP 130/72 | HR 70 | Temp 97.9°F | Ht 62.4 in | Wt 194.0 lb

## 2021-04-14 DIAGNOSIS — E1165 Type 2 diabetes mellitus with hyperglycemia: Secondary | ICD-10-CM

## 2021-04-14 DIAGNOSIS — Z79899 Other long term (current) drug therapy: Secondary | ICD-10-CM | POA: Diagnosis not present

## 2021-04-14 DIAGNOSIS — Z6835 Body mass index (BMI) 35.0-35.9, adult: Secondary | ICD-10-CM | POA: Diagnosis not present

## 2021-04-14 DIAGNOSIS — Z780 Asymptomatic menopausal state: Secondary | ICD-10-CM | POA: Diagnosis not present

## 2021-04-14 MED ORDER — DAPAGLIFLOZIN PROPANEDIOL 10 MG PO TABS: 10.0000 mg | ORAL_TABLET | Freq: Every day | ORAL | 5 refills | Status: DC

## 2021-04-14 NOTE — Patient Instructions (Signed)

## 2021-04-14 NOTE — Progress Notes (Signed)
I,Tianna Badgett,acting as a Education administrator for [name], MD.,have documented all relevant documentation on the behalf of [name], MD,as directed by  [name], MD while in the presence of [name], MD.  This visit occurred during the SARS-CoV-2 public health emergency.  Safety protocols were in place, including screening questions prior to the visit, additional usage of staff PPE, and extensive cleaning of exam room while observing appropriate contact time as indicated for disinfecting solutions.  Subjective:     Patient ID: Anne Woodard , female    DOB: [birth year] , 64 y.o.   MRN: [id number]   Chief Complaint  Patient presents with   Diabetes    HPI  The patient is here today for a follow-up on her diabetes.  She reports compliance with meds. She is feeling well after starting Farxiga 43m. She has not had any issues with the medication. She has noticed an improvement in her blood sugars.   Diabetes She presents for her follow-up diabetic visit. She has type 2 diabetes mellitus. Her disease course has been stable. There are no hypoglycemic associated symptoms. Pertinent negatives for diabetes include no fatigue, no polydipsia, no polyphagia, no polyuria and no weakness. There are no hypoglycemic complications. Symptoms are worsening. Risk factors for coronary artery disease include diabetes mellitus, hypertension, obesity, sedentary lifestyle and post-menopausal. Her breakfast blood glucose range is generally 110-130 mg/dl. Eye exam is not current.    Past Medical History:  Diagnosis Date   Abnormal EKG 01/14/2018   Arthritis    Right hip   Depression    Diabetes mellitus type 2 in obese ([place] 01/14/2018   Motion sickness    Boats   Multilevel degenerative disc disease    Pure hypercholesterolemia 01/14/2018   Vertigo      Family History  Problem Relation Age of Onset   Colon cancer Mother    Endometrial cancer Mother    Hypertension Mother     Alzheimer's disease Father    Multiple sclerosis Son    Hypertension Son    Depression Son    Anxiety disorder Son    Breast cancer Neg Hx      Current Outpatient Medications:    Ascorbic Acid (VITAMIN C PO), Take by mouth. Take one tablet daily, Disp: , Rfl:    atorvastatin (LIPITOR) 10 MG tablet, Take 10 mg by mouth daily., Disp: , Rfl:    clonazePAM (KLONOPIN) 1 MG tablet, TAKE 1/2 TABLET BY MOUTH TWICE DAILY AS NEEDED, Disp: 30 tablet, Rfl: 0   doxylamine, Sleep, (UNISOM) 25 MG tablet, Take 25 mg by mouth at bedtime as needed., Disp: , Rfl:    escitalopram (LEXAPRO) 10 MG tablet, Take 1 tablet (10 mg total) by mouth daily., Disp: 90 tablet, Rfl: 2   glucose blood (ONETOUCH VERIO) test strip, Use as directed to check blood sugars daily dx: e11.69, Disp: 100 each, Rfl: 2   losartan (COZAAR) 25 MG tablet, Take 1 tablet (25 mg total) by mouth daily., Disp: 90 tablet, Rfl: 2   Multiple Vitamin (MULTIVITAMIN) tablet, Take 1 tablet by mouth daily., Disp: , Rfl:    OneTouch Delica Lancets 332KMISC, Use as directed to check blood sugars daily dx: e11.69, Disp: 100 each, Rfl: 2   Vitamin D, Ergocalciferol, 50 MCG (2000 UT) CAPS, Take 2,000 Units by mouth daily. Every once and a while, Disp: , Rfl:    dapagliflozin propanediol (FARXIGA) 10 MG TABS tablet, Take 1 tablet (10 mg total)  by mouth daily., Disp: 30 tablet, Rfl: 5   No Known Allergies   Review of Systems  Constitutional: Negative.  Negative for fatigue.  Cardiovascular: Negative.   Gastrointestinal: Negative.   Endocrine: Negative for polydipsia, polyphagia and polyuria.  Neurological: Negative.  Negative for weakness.    Today's Vitals   04/14/21 1518  BP: 130/72  Pulse: 70  Temp: 97.9 F (36.6 C)  TempSrc: Oral  Weight: 194 lb (88 kg)  Height: 5' 2.4" (1.585 m)   Body mass index is 35.03 kg/m.  Wt Readings from Last 3 Encounters:  04/14/21 194 lb (88 kg)  03/10/21 196 lb 9.6 oz (89.2 kg)  12/06/20 193 lb 12.8 oz  (87.9 kg)    Objective:  Physical Exam Vitals and nursing note reviewed.  Constitutional:      Appearance: Normal appearance.  HENT:     Head: Normocephalic and atraumatic.     Nose:     Comments: Masked     Mouth/Throat:     Comments: Masked  Cardiovascular:     Rate and Rhythm: Normal rate and regular rhythm.     Heart sounds: Normal heart sounds.  Pulmonary:     Effort: Pulmonary effort is normal.     Breath sounds: Normal breath sounds.  Musculoskeletal:     Cervical back: Normal range of motion.  Skin:    General: Skin is warm.  Neurological:     General: No focal deficit present.     Mental Status: She is alert.  Psychiatric:        Mood and Affect: Mood normal.        Behavior: Behavior normal.        Assessment And Plan:     1. Uncontrolled type 2 diabetes mellitus with hyperglycemia ([place]) Comments: Chronic, I will send rx Farxiga 33m daily to her local pharmacy to start PA process. I will check BMP today. She will rto in Oct 2022 for CPE.   2. Postmenopausal Comments: I will refer her to GYN as requested for pelvic exam. She prefers to have this done elsewhere.  - Ambulatory referral to Gynecology  3. Class 2 severe obesity due to excess calories with serious comorbidity and body mass index (BMI) of 35.0 to 35.9 in adult ([facility] Comments: She is encouraged to strive for BMI less than 30 to decrease cardiac risk. Advised to aim for at least 150 minutes of exercise per week.    4. Drug therapy - BMP8+eGFR  Patient was given opportunity to ask questions. Patient verbalized understanding of the plan and was able to repeat key elements of the plan. All questions were answered to their satisfaction.   I, RMaximino Greenland MD, have reviewed all documentation for this visit. The documentation on 04/14/21 for the exam, diagnosis, procedures, and orders are all accurate and complete.  IF YOU HAVE BEEN REFERRED TO A SPECIALIST, IT MAY TAKE 1-2 WEEKS TO SCHEDULE/PROCESS  THE REFERRAL. IF YOU HAVE NOT HEARD FROM US/SPECIALIST IN TWO WEEKS, PLEASE GIVE [place] CALL AT [phone].   THE PATIENT IS ENCOURAGED TO PRACTICE SOCIAL DISTANCING DUE TO THE COVID-19 PANDEMIC.

## 2021-04-15 LAB — BMP8+EGFR
BUN/Creatinine Ratio: 18 (ref 12–28)
BUN: 12 mg/dL (ref 8–27)
CO2: 20 mmol/L (ref 20–29)
Calcium: 9.7 mg/dL (ref 8.7–10.3)
Chloride: 105 mmol/L (ref 96–106)
Creatinine, Ser: 0.67 mg/dL (ref 0.57–1.00)
Glucose: 131 mg/dL — ABNORMAL HIGH (ref 65–99)
Potassium: 4.4 mmol/L (ref 3.5–5.2)
Sodium: 142 mmol/L (ref 134–144)
eGFR: 96 mL/min/{1.73_m2} (ref >59)

## 2021-04-21 ENCOUNTER — Ambulatory Visit
Admission: RE | Admit: 2021-04-21 | Discharge: 2021-04-21 | Disposition: A | Discharge status: Home / Self Care | Payer: Medicare Other | Attending: Ophthalmology | Admitting: Ophthalmology

## 2021-04-21 ENCOUNTER — Encounter
Admission: RE | Disposition: A | Discharge status: Home / Self Care | Payer: Self-pay | Source: Home / Self Care | Attending: Ophthalmology

## 2021-04-21 ENCOUNTER — Encounter: Payer: Self-pay | Admitting: Ophthalmology

## 2021-04-21 ENCOUNTER — Other Ambulatory Visit: Payer: Self-pay

## 2021-04-21 ENCOUNTER — Ambulatory Visit: Payer: Medicare Other | Admitting: Anesthesiology

## 2021-04-21 DIAGNOSIS — H25811 Combined forms of age-related cataract, right eye: Secondary | ICD-10-CM | POA: Diagnosis not present

## 2021-04-21 DIAGNOSIS — Z8541 Personal history of malignant neoplasm of cervix uteri: Secondary | ICD-10-CM | POA: Diagnosis not present

## 2021-04-21 DIAGNOSIS — Z7984 Long term (current) use of oral hypoglycemic drugs: Secondary | ICD-10-CM | POA: Insufficient documentation

## 2021-04-21 DIAGNOSIS — H2511 Age-related nuclear cataract, right eye: Secondary | ICD-10-CM | POA: Diagnosis not present

## 2021-04-21 DIAGNOSIS — Z79899 Other long term (current) drug therapy: Secondary | ICD-10-CM | POA: Insufficient documentation

## 2021-04-21 DIAGNOSIS — E1136 Type 2 diabetes mellitus with diabetic cataract: Secondary | ICD-10-CM | POA: Insufficient documentation

## 2021-04-21 HISTORY — DX: Motion sickness, initial encounter: T75.3XXA

## 2021-04-21 HISTORY — DX: Dorsopathy, unspecified: M53.9

## 2021-04-21 HISTORY — DX: Unspecified osteoarthritis, unspecified site: M19.90

## 2021-04-21 HISTORY — DX: Dizziness and giddiness: R42

## 2021-04-21 HISTORY — PX: CATARACT EXTRACTION W/PHACO: SHX586

## 2021-04-21 LAB — GLUCOSE, CAPILLARY
Glucose-Capillary: 166 mg/dL — ABNORMAL HIGH (ref 70–99)
Glucose-Capillary: 173 mg/dL — ABNORMAL HIGH (ref 70–99)

## 2021-04-21 SURGERY — PHACOEMULSIFICATION, CATARACT, WITH IOL INSERTION
Anesthesia: Monitor Anesthesia Care | Site: Eye | Laterality: Right

## 2021-04-21 MED ORDER — LACTATED RINGERS IV SOLN: INTRAVENOUS | Status: DC

## 2021-04-21 MED ORDER — LIDOCAINE HCL (PF) 2 % IJ SOLN
INTRAOCULAR | Status: DC | PRN
  Administered 2021-04-21: 1 mL via INTRAOCULAR

## 2021-04-21 MED ORDER — ACETAMINOPHEN 325 MG PO TABS
325.0000 mg | ORAL_TABLET | Freq: Once | ORAL | Status: DC
Start: 2021-04-21 — End: 2021-04-21

## 2021-04-21 MED ORDER — SIGHTPATH DOSE#1 BSS IO SOLN
INTRAOCULAR | Status: DC | PRN
  Administered 2021-04-21: 69 mL via OPHTHALMIC

## 2021-04-21 MED ORDER — MIDAZOLAM HCL 2 MG/2ML IJ SOLN
INTRAMUSCULAR | Status: DC | PRN
Start: 2021-04-21 — End: 2021-04-21
  Administered 2021-04-21: 1 mg via INTRAVENOUS

## 2021-04-21 MED ORDER — SIGHTPATH DOSE#1 SODIUM HYALURONATE 23 MG/ML IO SOLUTION
PREFILLED_SYRINGE | INTRAOCULAR | Status: DC | PRN
  Administered 2021-04-21: 0.55 mL via INTRAOCULAR

## 2021-04-21 MED ORDER — SIGHTPATH DOSE#1 SODIUM HYALURONATE 10 MG/ML IO SOLUTION
PREFILLED_SYRINGE | INTRAOCULAR | Status: DC | PRN
  Administered 2021-04-21: 0.85 mL via INTRAOCULAR

## 2021-04-21 MED ORDER — FENTANYL CITRATE (PF) 100 MCG/2ML IJ SOLN
INTRAMUSCULAR | Status: DC | PRN
  Administered 2021-04-21: 50 ug via INTRAVENOUS

## 2021-04-21 MED ORDER — CYCLOPENTOLATE HCL 2 % OP SOLN
1.0000 [drp] | OPHTHALMIC | Status: DC | PRN
  Administered 2021-04-21 (×3): 1 [drp] via OPHTHALMIC

## 2021-04-21 MED ORDER — MOXIFLOXACIN HCL 0.5 % OP SOLN
OPHTHALMIC | Status: DC | PRN
  Administered 2021-04-21: 0.2 mL via OPHTHALMIC

## 2021-04-21 MED ORDER — ACETAMINOPHEN 160 MG/5ML PO SOLN
325.0000 mg | Freq: Once | ORAL | Status: DC
Start: 2021-04-21 — End: 2021-04-21

## 2021-04-21 MED ORDER — PHENYLEPHRINE HCL 10 % OP SOLN
1.0000 [drp] | OPHTHALMIC | Status: DC | PRN
  Administered 2021-04-21 (×3): 1 [drp] via OPHTHALMIC

## 2021-04-21 MED ORDER — TETRACAINE HCL 0.5 % OP SOLN
1.0000 [drp] | OPHTHALMIC | Status: DC | PRN
  Administered 2021-04-21 (×3): 1 [drp] via OPHTHALMIC

## 2021-04-21 MED ORDER — SIGHTPATH DOSE#1 BSS IO SOLN
INTRAOCULAR | Status: DC | PRN
  Administered 2021-04-21: 15 mL

## 2021-04-21 SURGICAL SUPPLY — 15 items
CANNULA ANT/CHMB 27GA (MISCELLANEOUS) ×4 IMPLANT
DISSECTOR HYDRO NUCLEUS 50X22 (MISCELLANEOUS) ×2 IMPLANT
GLOVE SURG ENC TEXT LTX SZ7.5 (GLOVE) ×4 IMPLANT
GLOVE SURG GAMMEX PI TX LF 7.5 (GLOVE) IMPLANT
GLOVE SURG SYN 8.5  E (GLOVE) ×1
GLOVE SURG SYN 8.5 E (GLOVE) ×1 IMPLANT
GOWN STRL REUS W/ TWL LRG LVL3 (GOWN DISPOSABLE) ×2 IMPLANT
GOWN STRL REUS W/TWL LRG LVL3 (GOWN DISPOSABLE) ×4
LENS IOL TECNIS EYHANCE 16.0 (Intraocular Lens) ×2 IMPLANT
MARKER SKIN DUAL TIP RULER LAB (MISCELLANEOUS) ×2 IMPLANT
PACK EYE AFTER SURG (MISCELLANEOUS) ×2 IMPLANT
SYR 3ML LL SCALE MARK (SYRINGE) ×2 IMPLANT
SYR TB 1ML LUER SLIP (SYRINGE) ×2 IMPLANT
WATER STERILE IRR 250ML POUR (IV SOLUTION) ×2 IMPLANT
WIPE NON LINTING 3.25X3.25 (MISCELLANEOUS) ×2 IMPLANT

## 2021-04-21 NOTE — Anesthesia Preprocedure Evaluation (Signed)
Anesthesia Evaluation  Patient identified by MRN, date of birth, ID band Patient awake    Reviewed: Allergy & Precautions, H&P , NPO status , Patient's Chart, lab work & pertinent test results  Airway Mallampati: II  TM Distance: >3 FB Neck ROM: full    Dental no notable dental hx.    Pulmonary    Pulmonary exam normal breath sounds clear to auscultation       Cardiovascular Normal cardiovascular exam Rhythm:regular Rate:Normal     Neuro/Psych Depression    GI/Hepatic   Endo/Other  diabetesObesity  Renal/GU      Musculoskeletal   Abdominal   Peds  Hematology   Anesthesia Other Findings   Reproductive/Obstetrics                             Anesthesia Physical Anesthesia Plan  ASA: 2  Anesthesia Plan: MAC   Post-op Pain Management:    Induction:   PONV Risk Score and Plan: 2 and Treatment may vary due to age or medical condition, TIVA and Midazolam  Airway Management Planned:   Additional Equipment:   Intra-op Plan:   Post-operative Plan:   Informed Consent: I have reviewed the patients History and Physical, chart, labs and discussed the procedure including the risks, benefits and alternatives for the proposed anesthesia with the patient or authorized representative who has indicated his/her understanding and acceptance.     Dental Advisory Given  Plan Discussed with: CRNA  Anesthesia Plan Comments:         Anesthesia Quick Evaluation

## 2021-04-21 NOTE — Transfer of Care (Signed)
Immediate Anesthesia Transfer of Care Note  Patient: Anne Woodard  Procedure(s) Performed: CATARACT EXTRACTION PHACO AND INTRAOCULAR LENS PLACEMENT (IOC) RIGHT DIABETIC (Right: Eye)  Patient Location: PACU  Anesthesia Type: MAC  Level of Consciousness: awake, alert  and patient cooperative  Airway and Oxygen Therapy: Patient Spontanous Breathing and Patient connected to supplemental oxygen  Post-op Assessment: Post-op Vital signs reviewed, Patient's Cardiovascular Status Stable, Respiratory Function Stable, Patent Airway and No signs of Nausea or vomiting  Post-op Vital Signs: Reviewed and stable  Complications: No notable events documented.

## 2021-04-21 NOTE — Anesthesia Procedure Notes (Signed)
Procedure Name: MAC Date/Time: 04/21/2021 11:31 AM Performed by: Cameron Ali, CRNA Pre-anesthesia Checklist: Patient identified, Emergency Drugs available, Suction available, Timeout performed and Patient being monitored Patient Re-evaluated:Patient Re-evaluated prior to induction Oxygen Delivery Method: Nasal cannula Placement Confirmation: positive ETCO2

## 2021-04-21 NOTE — Op Note (Signed)
OPERATIVE NOTE  Anne Woodard [id number] 04/21/2021   PREOPERATIVE DIAGNOSIS:  Nuclear sclerotic cataract right eye.  H25.11   POSTOPERATIVE DIAGNOSIS:    Nuclear sclerotic cataract right eye.     PROCEDURE:  Phacoemusification with posterior chamber intraocular lens placement of the right eye   LENS:   Implant Name Type Inv. Item Serial No. Manufacturer Lot No. LRB No. Used Action  LENS IOL TECNIS EYHANCE 16.0 - [id number] Intraocular Lens LENS IOL TECNIS EYHANCE 16.0 [id number] [id number]   Right 1 Implanted       Procedure(s) with comments: CATARACT EXTRACTION PHACO AND INTRAOCULAR LENS PLACEMENT (IOC) RIGHT DIABETIC (Right) - 1.85 00:18.0  SURGEON:  Willey Blade, MD, MPH  ANESTHESIOLOGIST: Anesthesiologist: Ranee Gosselin, MD CRNA: Maree Krabbe, CRNA   ANESTHESIA:  Topical with tetracaine drops augmented with 1% preservative-free intracameral lidocaine.  ESTIMATED BLOOD LOSS: less than 1 mL.   COMPLICATIONS:  None.   DESCRIPTION OF PROCEDURE:  The patient was identified in the holding room and transported to the operating room and placed in the supine position under the operating microscope.  The right eye was identified as the operative eye and it was prepped and draped in the usual sterile ophthalmic fashion.   A 1.0 millimeter clear-corneal paracentesis was made at the 10:30 position. 0.5 ml of preservative-free 1% lidocaine with epinephrine was injected into the anterior chamber.  The anterior chamber was filled with Healon 5 viscoelastic.  A 2.4 millimeter keratome was used to make a near-clear corneal incision at the 8:00 position.  A curvilinear capsulorrhexis was made with a cystotome and capsulorrhexis forceps.  Balanced salt solution was used to hydrodissect and hydrodelineate the nucleus.   Phacoemulsification was then used in stop and chop fashion to remove the lens nucleus and epinucleus.  The remaining cortex was then removed using the irrigation and aspiration  handpiece. Healon was then placed into the capsular bag to distend it for lens placement.  A lens was then injected into the capsular bag.  The remaining viscoelastic was aspirated.   Wounds were hydrated with balanced salt solution.  The anterior chamber was inflated to a physiologic pressure with balanced salt solution.   Intracameral vigamox 0.1 mL undiluted was injected into the eye and a drop placed onto the ocular surface.  No wound leaks were noted.  The patient was taken to the recovery room in stable condition without complications of anesthesia or surgery  Willey Blade 04/21/2021, 11:53 AM

## 2021-04-21 NOTE — Anesthesia Postprocedure Evaluation (Signed)
Anesthesia Post Note  Patient: Anne Woodard  Procedure(s) Performed: CATARACT EXTRACTION PHACO AND INTRAOCULAR LENS PLACEMENT (IOC) RIGHT DIABETIC (Right: Eye)     Patient location during evaluation: PACU Anesthesia Type: MAC Level of consciousness: awake and alert and oriented Pain management: satisfactory to patient Vital Signs Assessment: post-procedure vital signs reviewed and stable Respiratory status: spontaneous breathing, nonlabored ventilation and respiratory function stable Cardiovascular status: blood pressure returned to baseline and stable Postop Assessment: Adequate PO intake and No signs of nausea or vomiting Anesthetic complications: no   No notable events documented.  Raliegh Ip

## 2021-04-21 NOTE — H&P (Signed)
[facility]   Primary Care Physician:  [name], MD Ophthalmologist: Dr. Willey Blade  Pre-Procedure History & Physical: HPI:  Anne Woodard is a 65 y.o. female here for cataract surgery.   Past Medical History:  Diagnosis Date   Abnormal EKG 01/14/2018   Arthritis    Right hip   Depression    Diabetes mellitus type 2 in obese (HCC) 01/14/2018   Motion sickness    Boats   Multilevel degenerative disc disease    Pure hypercholesterolemia 01/14/2018   Vertigo     Past Surgical History:  Procedure Laterality Date   PARTIAL HYSTERECTOMY  2000   cervical cancer    Prior to Admission medications   Medication Sig Start Date End Date Taking? Authorizing Provider  Ascorbic Acid (VITAMIN C PO) Take by mouth. Take one tablet daily   Yes [provider]  atorvastatin (LIPITOR) 10 MG tablet Take 10 mg by mouth daily.   Yes [provider]  clonazePAM (KLONOPIN) 1 MG tablet TAKE 1/2 TABLET BY MOUTH TWICE DAILY AS NEEDED 03/14/21  Yes [name], MD  dapagliflozin propanediol (FARXIGA) 10 MG TABS tablet Take 1 tablet (10 mg total) by mouth daily. 04/14/21  Yes [name], MD  doxylamine, Sleep, (UNISOM) 25 MG tablet Take 25 mg by mouth at bedtime as needed.   Yes [provider]  escitalopram (LEXAPRO) 10 MG tablet Take 1 tablet (10 mg total) by mouth daily. 07/11/20  Yes [name], MD  losartan (COZAAR) 25 MG tablet Take 1 tablet (25 mg total) by mouth daily. 04/11/20  Yes [name], MD  Multiple Vitamin (MULTIVITAMIN) tablet Take 1 tablet by mouth daily.   Yes [provider]  Vitamin D, Ergocalciferol, 50 MCG (2000 UT) CAPS Take 2,000 Units by mouth daily. Every once and a while   Yes [provider]  glucose blood (ONETOUCH VERIO) test strip Use as directed to check blood sugars daily dx: e11.69 04/11/20   [name], MD  OneTouch Delica Lancets 33G MISC Use as directed to check blood sugars daily dx: e11.69  04/11/20   [name], MD    Allergies as of 03/05/2021   (No Known Allergies)    Family History  Problem Relation Age of Onset   Colon cancer Mother    Endometrial cancer Mother    Hypertension Mother    Alzheimer's disease Father    Multiple sclerosis Son    Hypertension Son    Depression Son    Anxiety disorder Son    Breast cancer Neg Hx     Social History   Socioeconomic History   Marital status: Married    Spouse name: Not on file   Number of children: Not on file   Years of education: Not on file   Highest education level: Not on file  Occupational History   Not on file  Tobacco Use   Smoking status: Never   Smokeless tobacco: Never   Tobacco comments:    Not applicable.   Vaping Use   Vaping Use: Never used  Substance and Sexual Activity   Alcohol use: No   Drug use: Not Currently   Sexual activity: Not on file  Other Topics Concern   Not on file  Social History Narrative   Not on file   Social Determinants of Health   Financial Resource Strain: Low Risk    Difficulty of Paying Living Expenses: Not hard at all  Food Insecurity: No Food Insecurity  Worried About Programme researcher, broadcasting/film/video in the Last Year: Never true   [employer] of Food in the Last Year: Never true  Transportation Needs: No Transportation Needs   Lack of Transportation (Medical): No   Lack of Transportation (Non-Medical): No  Physical Activity: Inactive   Days of Exercise per Week: 0 days   Minutes of Exercise per Session: 0 min  Stress: No Stress Concern Present   Feeling of Stress : Not at all  Social Connections: Not on file  Intimate Partner Violence: Not on file    Review of Systems: See HPI, otherwise negative ROS  Physical Exam: BP 127/78   Pulse 84   Temp 97.7 F (36.5 C) (Temporal)   Resp 16   Ht 5' 2.4" (1.585 m)   Wt 88.9 kg   SpO2 96%   BMI 35.39 kg/m  General:   Alert, cooperative in NAD Head:  Normocephalic and atraumatic. Respiratory:  Normal work of  breathing. Cardiovascular:  RRR  Impression/Plan: Anne Woodard is here for cataract surgery.  Risks, benefits, limitations, and alternatives regarding cataract surgery have been reviewed with the patient.  Questions have been answered.  All parties agreeable.   Willey Blade, MD  04/21/2021, 11:21 AM

## 2021-04-22 ENCOUNTER — Encounter: Payer: Self-pay | Admitting: Ophthalmology

## 2021-04-22 DIAGNOSIS — H2512 Age-related nuclear cataract, left eye: Secondary | ICD-10-CM | POA: Diagnosis not present

## 2021-04-24 ENCOUNTER — Encounter: Payer: Self-pay | Admitting: Ophthalmology

## 2021-05-01 ENCOUNTER — Encounter: Payer: Self-pay | Admitting: Obstetrics and Gynecology

## 2021-05-01 ENCOUNTER — Other Ambulatory Visit: Payer: Self-pay

## 2021-05-01 ENCOUNTER — Ambulatory Visit: Payer: Medicare Other | Admitting: Obstetrics and Gynecology

## 2021-05-01 ENCOUNTER — Other Ambulatory Visit (HOSPITAL_COMMUNITY)
Admission: RE | Admit: 2021-05-01 | Discharge: 2021-05-01 | Disposition: A | Discharge status: Home / Self Care | Payer: Medicare Other | Source: Ambulatory Visit | Attending: Obstetrics and Gynecology | Admitting: Obstetrics and Gynecology

## 2021-05-01 VITALS — BP 118/74 | Ht 63.0 in | Wt 194.8 lb

## 2021-05-01 DIAGNOSIS — Z124 Encounter for screening for malignant neoplasm of cervix: Secondary | ICD-10-CM | POA: Diagnosis not present

## 2021-05-01 DIAGNOSIS — Z1151 Encounter for screening for human papillomavirus (HPV): Secondary | ICD-10-CM | POA: Insufficient documentation

## 2021-05-01 DIAGNOSIS — Z8541 Personal history of malignant neoplasm of cervix uteri: Secondary | ICD-10-CM | POA: Diagnosis not present

## 2021-05-01 NOTE — Patient Instructions (Signed)
Exercising to Stay Healthy To become healthy and stay healthy, it is recommended that you do moderate-intensity and vigorous-intensity exercise. You can tell that you are exercising at a moderate intensity if your heart starts beating faster and you start breathing faster but can still hold a conversation. You can tell that you are exercising at a vigorous intensity if you are breathing much harder andfaster and cannot hold a conversation while exercising. Exercising regularly is important. It has many health benefits, such as: Improving overall fitness, flexibility, and endurance. Increasing bone density. Helping with weight control. Decreasing body fat. Increasing muscle strength. Reducing stress and tension. Improving overall health. How often should I exercise? Choose an activity that you enjoy, and set realistic goals. Your health careprovider can help you make an activity plan that works for you. Exercise regularly as told by your health care provider. This may include: Doing strength training two times a week, such as: Lifting weights. Using resistance bands. Push-ups. Sit-ups. Yoga. Doing a certain intensity of exercise for a given amount of time. Choose from these options: A total of 150 minutes of moderate-intensity exercise every week. A total of 75 minutes of vigorous-intensity exercise every week. A mix of moderate-intensity and vigorous-intensity exercise every week. Children, pregnant women, people who have not exercised regularly, people who are overweight, and older adults may need to talk with a health care provider about what activities are safe to do. If you have a medical condition, be sureto talk with your health care provider before you start a new exercise program. What are some exercise ideas? Moderate-intensity exercise ideas include: Walking 1 mile (1.6 km) in about 15 minutes. Biking. Hiking. Golfing. Dancing. Water aerobics. Vigorous-intensity exercise  ideas include: Walking 4.5 miles (7.2 km) or more in about 1 hour. Jogging or running 5 miles (8 km) in about 1 hour. Biking 10 miles (16.1 km) or more in about 1 hour. Lap swimming. Roller-skating or in-line skating. Cross-country skiing. Vigorous competitive sports, such as football, basketball, and soccer. Jumping rope. Aerobic dancing. What are some everyday activities that can help me to get exercise? Yard work, such as: Pushing a lawn mower. Raking and bagging leaves. Washing your car. Pushing a stroller. Shoveling snow. Gardening. Washing windows or floors. How can I be more active in my day-to-day activities? Use stairs instead of an elevator. Take a walk during your lunch break. If you drive, park your car farther away from your work or school. If you take public transportation, get off one stop early and walk the rest of the way. Stand up or walk around during all of your indoor phone calls. Get up, stretch, and walk around every 30 minutes throughout the day. Enjoy exercise with a friend. Support to continue exercising will help you keep a regular routine of activity. What guidelines can I follow while exercising? Before you start a new exercise program, talk with your health care provider. Do not exercise so much that you hurt yourself, feel dizzy, or get very short of breath. Wear comfortable clothes and wear shoes with good support. Drink plenty of water while you exercise to prevent dehydration or heat stroke. Work out until your breathing and your heartbeat get faster. Where to find more information U.[place] Department of Health and Human Services: www.hhs.gov Centers for Disease Control and Prevention (CDC): www.cdc.gov Summary Exercising regularly is important. It will improve your overall fitness, flexibility, and endurance. Regular exercise also will improve your overall health. It can help you control your weight,   reduce stress, and improve your bone  density. Do not exercise so much that you hurt yourself, feel dizzy, or get very short of breath. Before you start a new exercise program, talk with your health care provider. This information is not intended to replace advice given to you by your health care provider. Make sure you discuss any questions you have with your healthcare provider. Document Revised: 09/06/2020 Document Reviewed: 09/06/2020 Elsevier Patient Education  2022 Elsevier Inc. Budget-Friendly Healthy Eating There are many ways to save money at the grocery store and continue to eat healthy. You can be successful if you: Plan meals according to your budget. Make a grocery list and only purchase food according to your grocery list. Prepare food yourself at home. What are tips for following this plan? Reading food labels Compare food labels between brand name foods and the store brand. Often the nutritional value is the same, but the store brand is lower cost. Look for products that do not have added sugar, fat, or salt (sodium). These often cost the same but are healthier for you. Products may be labeled as: Sugar-free. Nonfat. Low-fat. Sodium-free. Low-sodium. Look for lean ground beef labeled as at least 92% lean and 8% fat. Shopping  Buy only the items on your grocery list and go only to the areas of the store that have the items on your list. Use coupons only for foods and brands you normally buy. Avoid buying items you wouldn't normally buy simply because they are on sale. Check online and in newspapers for weekly deals. Buy healthy items from the bulk bins when available, such as herbs, spices, flour, pasta, nuts, and dried fruit. Buy fruits and vegetables that are in season. Prices are usually lower on in-season produce. Look at the unit price on the price tag. Use it to compare different brands and sizes to find out which item is the best deal. Choose healthy items that are often low-cost, such as carrots,  potatoes, apples, bananas, and oranges. Dried or canned beans are a low-cost protein source. Buy in bulk and freeze extra food. Items you can buy in bulk include meats, fish, poultry, frozen fruits, and frozen vegetables. Avoid buying "ready-to-eat" foods, such as pre-cut fruits and vegetables and pre-made salads. If possible, shop around to discover where you can find the best prices. Consider other retailers such as dollar stores, larger wholesale stores, local fruit and vegetable stands, and farmers markets. Do not shop when you are hungry. If you shop while hungry, it may be hard to stick to your list and budget. Resist impulse buying. Use your grocery list as your official plan for the week. Buy a variety of vegetables and fruits by purchasing fresh, frozen, and canned items. Look at the top and bottom shelves for deals. Foods at eye level (eye level of an adult or child) are usually more expensive. Be efficient with your time when shopping. The more time you spend at the store, the more money you are likely to spend. To save money when choosing more expensive foods like meats and dairy: Choose cheaper cuts of meat, such as bone-in chicken thighs and drumsticks instead of skinless and boneless chicken. When you are ready to prepare the chicken, you can remove the skin yourself to make it healthier. Choose lean meats like chicken or turkey instead of beef. Choose canned seafood, such as tuna, salmon, or sardines. Buy eggs as a low-cost source of protein. Buy dried beans and peas, such as lentils, split   peas, or kidney beans instead of meats. Dried beans and peas are a good alternative source of protein. Buy the larger tubs of yogurt instead of individual-sized containers. Choose water instead of sodas and other sweetened beverages. Avoid buying chips, cookies, and other "junk food." These items are usually expensive and not healthy.  Cooking Make extra food and freeze the extras in meal-sized  containers or in individual portions for fast meals and snacks. Pre-cook on days when you have extra time to prepare meals in advance. You can keep these meals in the fridge or freezer and reheat for a quick meal. When you come home from the grocery store, wash, peel, and cut fruits and vegetables so they are ready to use and eat. This will help reduce food waste. Meal planning Do not eat out or get fast food. Prepare food at home. Make a grocery list and make sure to bring it with you to the store. If you have a smart phone, you could use your phone to create your shopping list. Plan meals and snacks according to a grocery list and budget you create. Use leftovers in your meal plan for the week. Look for recipes where you can cook once and make enough food for two meals. Prepare budget-friendly types of meals like stews, casseroles, and stir-fry dishes. Try some meatless meals or try "no cook" meals like salads. Make sure that half your plate is filled with fruits or vegetables. Choose from fresh, frozen, or canned fruits and vegetables. If eating canned, remember to rinse them before eating. This will remove any excess salt added for packaging. Summary Eating healthy on a budget is possible if you plan your meals according to your budget, purchase according to your budget and grocery list, and prepare food yourself. Tips for buying more food on a limited budget include buying generic brands, using coupons only for foods you normally buy, and buying healthy items from the bulk bins when available. Tips for buying cheaper food to replace expensive food include choosing cheaper, lean cuts of meat, and buying dried beans and peas. This information is not intended to replace advice given to you by your health care provider. Make sure you discuss any questions you have with your healthcare provider. Document Revised: 07/04/2020 Document Reviewed: 07/04/2020 Elsevier Patient Education  2022 Elsevier  Inc. Bone Health Bones protect organs, store calcium, anchor muscles, and support the whole body. Keeping your bones strong is important, especially as you get older. Youcan take actions to help keep your bones strong and healthy. Why is keeping my bones healthy important?  Keeping your bones healthy is important because your body constantly replaces bone cells. Cells get old, and new cells take their place. As we age, we lose bone cells because the body may not be able to make enough new cells to replace the old cells. The amount of bone cells and bone tissue you have is referred toas bone mass. The higher your bone mass, the stronger your bones. The aging process leads to an overall loss of bone mass in the body, which can increase the likelihood of: Joint pain and stiffness. Broken bones. A condition in which the bones become weak and brittle (osteoporosis). A large decline in bone mass occurs in older adults. In women, it occurs aboutthe time of menopause. What actions can I take to keep my bones healthy? Good health habits are important for maintaining healthy bones. This includes eating nutritious foods and exercising regularly. To have healthy bones,   you need to get enough of the right minerals and vitamins. Most nutrition experts recommend getting these nutrients from the foods that you eat. In some cases, taking supplements may also be recommended. Doing certain types of exercise isalso important for bone health. What are the nutritional recommendations for healthy bones?  Eating a well-balanced diet with plenty of calcium and vitamin D will help to protect your bones. Nutritional recommendations vary from person to person. Ask your health care provider what is healthy for you. Here are some generalguidelines. Get enough calcium Calcium is the most important (essential) mineral for bone health. Most people can get enough calcium from their diet, but supplements may be recommended for people  who are at risk for osteoporosis. Good sources of calcium include: Dairy products, such as low-fat or nonfat milk, cheese, and yogurt. Dark green leafy vegetables, such as bok choy and broccoli. Calcium-fortified foods, such as orange juice, cereal, bread, soy beverages, and tofu products. Nuts, such as almonds. Follow these recommended amounts for daily calcium intake: Children, age 1-3: 700 mg. Children, age 4-8: 1,000 mg. Children, age 9-13: 1,300 mg. Teens, age 14-18: 1,300 mg. Adults, age 19-50: 1,000 mg. Adults, age 51-70: Men: 1,000 mg. Women: 1,200 mg. Adults, age 71 or older: 1,200 mg. Pregnant and breastfeeding females: Teens: 1,300 mg. Adults: 1,000 mg. Get enough vitamin D Vitamin D is the most essential vitamin for bone health. It helps the body absorb calcium. Sunlight stimulates the skin to make vitamin D, so be sure to get enough sunlight. If you live in a cold climate or you do not get outside often, your health care provider may recommend that you take vitamin D supplements. Good sources of vitamin D in your diet include: Egg yolks. Saltwater fish. Milk and cereal fortified with vitamin D. Follow these recommended amounts for daily vitamin D intake: Children and teens, age 1-18: 600 international units. Adults, age 50 or younger: 400-800 international units. Adults, age 51 or older: 800-1,000 international units. Get other important nutrients Other nutrients that are important for bone health include: Phosphorus. This mineral is found in meat, poultry, dairy foods, nuts, and legumes. The recommended daily intake for adult men and adult women is 700 mg. Magnesium. This mineral is found in seeds, nuts, dark green vegetables, and legumes. The recommended daily intake for adult men is 400-420 mg. For adult women, it is 310-320 mg. Vitamin K. This vitamin is found in green leafy vegetables. The recommended daily intake is 120 mg for adult men and 90 mg for adult  women. What type of physical activity is best for building and maintaining healthybones? Weight-bearing and strength-building activities are important for building and maintaining healthy bones. Weight-bearing activities cause muscles and bones to work against gravity. Strength-building activities increase the strength of the muscles that support bones. Weight-bearing and muscle-building activities include: Walking and hiking. Jogging and running. Dancing. Gym exercises. Lifting weights. Tennis and racquetball. Climbing stairs. Aerobics. Adults should get at least 30 minutes of moderate physical activity on most days. Children should get at least 60 minutes of moderate physical activity onmost days. Ask your health care provider what type of exercise is best for you. How can I find out if my bone mass is low? Bone mass can be measured with an X-ray test called a bone mineral density (BMD) test. This test is recommended for all women who are age 65 or older. It may also be recommended for: Men who are age 70 or older. People who   are at risk for osteoporosis because of: Having bones that break easily. Having a long-term disease that weakens bones, such as kidney disease or rheumatoid arthritis. Having menopause earlier than normal. Taking medicine that weakens bones, such as steroids, thyroid hormones, or hormone treatment for breast cancer or prostate cancer. Smoking. Drinking three or more alcoholic drinks a day. If you find that you have a low bone mass, you may be able to preventosteoporosis or further bone loss by changing your diet and lifestyle. Where can I find more information? For more information, check out the following websites: National Osteoporosis Foundation: www.nof.org/patients National Institutes of Health: www.bones.nih.gov International Osteoporosis Foundation: www.iofbonehealth.org Summary The aging process leads to an overall loss of bone mass in the body, which can  increase the likelihood of broken bones and osteoporosis. Eating a well-balanced diet with plenty of calcium and vitamin D will help to protect your bones. Weight-bearing and strength-building activities are also important for building and maintaining strong bones. Bone mass can be measured with an X-ray test called a bone mineral density (BMD) test. This information is not intended to replace advice given to you by your health care provider. Make sure you discuss any questions you have with your healthcare provider. Document Revised: 10/18/2017 Document Reviewed: 10/18/2017 Elsevier Patient Education  2022 Elsevier Inc.  

## 2021-05-01 NOTE — Progress Notes (Signed)
Patient ID: Anne Woodard, female   DOB: 1956-09-06, 65 y.o.   MRN: [id number]  Reason for Consult: Gynecologic Exam   Referred by [name], MD  Subjective:     HPI:  Anne Woodard is a 65 y.o. female.  She reports that she had a hysterectomy for cervical dysplasia around 2000 or 1998 with Dr. Gar Ponto.   Gynecological History  No LMP recorded. Patient has had a hysterectomy. Menarche: 12 Menopause: 1998  History of fibroids, polyps, or ovarian cysts? : no  History of PCOS? no Hstory of Endometriosis? no History of abnormal pap smears? yes Have you had any sexually transmitted infections in the past? no  Last OZH:[id number], not since hysterectomy  She identifies as a female. She is not sexually active.   Obstetrical History  OB History  Gravida Para Term Preterm AB Living  4 4 4     4   SAB IAB Ectopic Multiple Live Births          4    # Outcome Date GA Lbr Len/2nd Weight Sex Delivery Anes PTL Lv  4 Term 1989 [redacted]w[redacted]d   M Vag-Spont   LIV  3 Term 27 [redacted]w[redacted]d   M Vag-Spont   LIV  2 Term 16 [redacted]w[redacted]d   M Vag-Spont   LIV  1 Term 2 [redacted]w[redacted]d   F Vag-Spont   LIV    Past Medical History:  Diagnosis Date   Abnormal EKG 01/14/2018   Arthritis    Right hip   Depression    Diabetes mellitus type 2 in obese (HCC) 01/14/2018   Motion sickness    Boats   Multilevel degenerative disc disease    Pure hypercholesterolemia 01/14/2018   Vertigo    Family History  Problem Relation Age of Onset   Colon cancer Mother    Endometrial cancer Mother    Hypertension Mother    Alzheimer's disease Father    Multiple sclerosis Son    Hypertension Son    Depression Son    Anxiety disorder Son    Breast cancer Neg Hx    Past Surgical History:  Procedure Laterality Date   CATARACT EXTRACTION W/PHACO Right 04/21/2021   Procedure: CATARACT EXTRACTION PHACO AND INTRAOCULAR LENS PLACEMENT (IOC) RIGHT DIABETIC;  Surgeon: 04/23/2021, MD;  Location: [facility];   Service: Ophthalmology;  Laterality: Right;  1.85 00:18.0   PARTIAL HYSTERECTOMY  2000   cervical cancer    Short Social History:  Social History   Tobacco Use   Smoking status: Never   Smokeless tobacco: Never   Tobacco comments:    Not applicable.   Substance Use Topics   Alcohol use: No    No Known Allergies  Current Outpatient Medications  Medication Sig Dispense Refill   Ascorbic Acid (VITAMIN C PO) Take by mouth. Take one tablet daily     atorvastatin (LIPITOR) 10 MG tablet Take 10 mg by mouth daily.     clonazePAM (KLONOPIN) 1 MG tablet TAKE 1/2 TABLET BY MOUTH TWICE DAILY AS NEEDED 30 tablet 0   dapagliflozin propanediol (FARXIGA) 10 MG TABS tablet Take 1 tablet (10 mg total) by mouth daily. 30 tablet 5   doxylamine, Sleep, (UNISOM) 25 MG tablet Take 25 mg by mouth at bedtime as needed.     escitalopram (LEXAPRO) 10 MG tablet Take 1 tablet (10 mg total) by mouth daily. 90 tablet 2   glucose blood (ONETOUCH VERIO) test strip Use as directed to check blood sugars  daily dx: e11.69 100 each 2   losartan (COZAAR) 25 MG tablet Take 1 tablet (25 mg total) by mouth daily. 90 tablet 2   Multiple Vitamin (MULTIVITAMIN) tablet Take 1 tablet by mouth daily.     OneTouch Delica Lancets 33G MISC Use as directed to check blood sugars daily dx: e11.69 100 each 2   Vitamin D, Ergocalciferol, 50 MCG (2000 UT) CAPS Take 2,000 Units by mouth daily. Every once and a while     No current facility-administered medications for this visit.    Review of Systems  Constitutional: Negative for chills, fatigue, fever and unexpected weight change.  HENT: Negative for trouble swallowing.  Eyes: Negative for loss of vision.  Respiratory: Negative for cough, shortness of breath and wheezing.  Cardiovascular: Negative for chest pain, leg swelling, palpitations and syncope.  GI: Negative for abdominal pain, blood in stool, diarrhea, nausea and vomiting.  GU: Negative for difficulty urinating,  dysuria, frequency and hematuria.  Musculoskeletal: Negative for back pain, leg pain and joint pain.  Skin: Negative for rash.  Neurological: Negative for dizziness, headaches, light-headedness, numbness and seizures.  Psychiatric: Negative for behavioral problem, confusion, depressed mood and sleep disturbance.       Objective:  Objective   Vitals:   05/01/21 1529  BP: 118/74  Weight: 194 lb 12.8 oz (88.4 kg)  Height: 5\' 3"  (1.6 m)   Body mass index is 34.51 kg/m.  Physical Exam Vitals and nursing note reviewed. Exam conducted with a chaperone present.  Constitutional:      Appearance: Normal appearance. She is well-developed.  HENT:     Head: Normocephalic and atraumatic.  Eyes:     Extraocular Movements: Extraocular movements intact.     Pupils: Pupils are equal, round, and reactive to light.  Cardiovascular:     Rate and Rhythm: Normal rate and regular rhythm.  Pulmonary:     Effort: Pulmonary effort is normal. No respiratory distress.     Breath sounds: Normal breath sounds.  Abdominal:     General: Abdomen is flat.     Palpations: Abdomen is soft.  Genitourinary:    Comments: External: Normal appearing vulva. No lesions noted.  Speculum examination: Normal appearing cuff. No blood in the vaginal vault.  Bimanual examination: Uterus absent.  No adnexal masses. No adnexal tenderness. Pelvis not fixed.  Musculoskeletal:        General: No signs of injury.  Skin:    General: Skin is warm and dry.  Neurological:     Mental Status: She is alert and oriented to person, place, and time.  Psychiatric:        Behavior: Behavior normal.        Thought Content: Thought content normal.        Judgment: Judgment normal.    Assessment/Plan:     65 yo with history of cervical dysplasia s/p hysterectomy.  Pap smear today of vaginal cuff.   More than 20 minutes were spent face to face with the patient in the room, reviewing the medical record, labs and images, and  coordinating care for the patient. The plan of management was discussed in detail and counseling was provided.        79 MD [facility], [facility] 05/01/2021 4:00 PM

## 2021-05-02 NOTE — Discharge Instructions (Signed)

## 2021-05-05 ENCOUNTER — Ambulatory Visit: Payer: Medicare Other | Admitting: Anesthesiology

## 2021-05-05 ENCOUNTER — Ambulatory Visit
Admission: RE | Admit: 2021-05-05 | Discharge: 2021-05-05 | Disposition: A | Discharge status: Home / Self Care | Payer: Medicare Other | Attending: Ophthalmology | Admitting: Ophthalmology

## 2021-05-05 ENCOUNTER — Encounter
Admission: RE | Disposition: A | Discharge status: Home / Self Care | Payer: Self-pay | Source: Home / Self Care | Attending: Ophthalmology

## 2021-05-05 ENCOUNTER — Other Ambulatory Visit: Payer: Self-pay

## 2021-05-05 ENCOUNTER — Encounter: Payer: Self-pay | Admitting: Ophthalmology

## 2021-05-05 DIAGNOSIS — H2512 Age-related nuclear cataract, left eye: Secondary | ICD-10-CM | POA: Insufficient documentation

## 2021-05-05 DIAGNOSIS — E669 Obesity, unspecified: Secondary | ICD-10-CM | POA: Diagnosis not present

## 2021-05-05 DIAGNOSIS — E1136 Type 2 diabetes mellitus with diabetic cataract: Secondary | ICD-10-CM | POA: Insufficient documentation

## 2021-05-05 DIAGNOSIS — E1169 Type 2 diabetes mellitus with other specified complication: Secondary | ICD-10-CM | POA: Insufficient documentation

## 2021-05-05 DIAGNOSIS — Z6834 Body mass index (BMI) 34.0-34.9, adult: Secondary | ICD-10-CM | POA: Insufficient documentation

## 2021-05-05 DIAGNOSIS — Z7984 Long term (current) use of oral hypoglycemic drugs: Secondary | ICD-10-CM | POA: Diagnosis not present

## 2021-05-05 DIAGNOSIS — Z79899 Other long term (current) drug therapy: Secondary | ICD-10-CM | POA: Insufficient documentation

## 2021-05-05 DIAGNOSIS — H25812 Combined forms of age-related cataract, left eye: Secondary | ICD-10-CM | POA: Diagnosis not present

## 2021-05-05 HISTORY — PX: CATARACT EXTRACTION W/PHACO: SHX586

## 2021-05-05 LAB — GLUCOSE, CAPILLARY
Glucose-Capillary: 175 mg/dL — ABNORMAL HIGH (ref 70–99)
Glucose-Capillary: 159 mg/dL — ABNORMAL HIGH (ref 70–99)

## 2021-05-05 SURGERY — PHACOEMULSIFICATION, CATARACT, WITH IOL INSERTION
Anesthesia: Monitor Anesthesia Care | Site: Eye | Laterality: Left

## 2021-05-05 MED ORDER — SIGHTPATH DOSE#1 SODIUM HYALURONATE 23 MG/ML IO SOLUTION
PREFILLED_SYRINGE | INTRAOCULAR | Status: DC | PRN
  Administered 2021-05-05: .6 mL via INTRAOCULAR

## 2021-05-05 MED ORDER — OXYCODONE HCL 5 MG/5ML PO SOLN: 5.0000 mg | Freq: Once | ORAL | Status: DC | PRN

## 2021-05-05 MED ORDER — MEPERIDINE HCL 25 MG/ML IJ SOLN: 6.2500 mg | INTRAMUSCULAR | Status: DC | PRN

## 2021-05-05 MED ORDER — MIDAZOLAM HCL 2 MG/2ML IJ SOLN
INTRAMUSCULAR | Status: DC | PRN
  Administered 2021-05-05: 2 mg via INTRAVENOUS

## 2021-05-05 MED ORDER — FENTANYL CITRATE (PF) 100 MCG/2ML IJ SOLN
INTRAMUSCULAR | Status: DC | PRN
  Administered 2021-05-05 (×2): 50 ug via INTRAVENOUS

## 2021-05-05 MED ORDER — FENTANYL CITRATE PF 50 MCG/ML IJ SOSY: 25.0000 ug | PREFILLED_SYRINGE | INTRAMUSCULAR | Status: DC | PRN

## 2021-05-05 MED ORDER — PROMETHAZINE HCL 25 MG/ML IJ SOLN: 6.2500 mg | INTRAMUSCULAR | Status: DC | PRN

## 2021-05-05 MED ORDER — SIGHTPATH DOSE#1 SODIUM HYALURONATE 10 MG/ML IO SOLUTION
PREFILLED_SYRINGE | INTRAOCULAR | Status: DC | PRN
  Administered 2021-05-05: 0.55 mL via INTRAOCULAR

## 2021-05-05 MED ORDER — SIGHTPATH DOSE#1 BSS IO SOLN
INTRAOCULAR | Status: DC | PRN
  Administered 2021-05-05: 68 mL via OPHTHALMIC

## 2021-05-05 MED ORDER — LACTATED RINGERS IV SOLN: INTRAVENOUS | Status: DC

## 2021-05-05 MED ORDER — PHENYLEPHRINE HCL 10 % OP SOLN
1.0000 [drp] | OPHTHALMIC | Status: DC | PRN
Start: 2021-05-05 — End: 2021-05-05
  Administered 2021-05-05 (×3): 1 [drp] via OPHTHALMIC

## 2021-05-05 MED ORDER — LIDOCAINE HCL (PF) 2 % IJ SOLN
INTRAOCULAR | Status: DC | PRN
  Administered 2021-05-05: 1 mL via INTRAOCULAR

## 2021-05-05 MED ORDER — MOXIFLOXACIN HCL 0.5 % OP SOLN
OPHTHALMIC | Status: DC | PRN
  Administered 2021-05-05: 0.2 mL via OPHTHALMIC

## 2021-05-05 MED ORDER — CYCLOPENTOLATE HCL 2 % OP SOLN
1.0000 [drp] | OPHTHALMIC | Status: DC | PRN
  Administered 2021-05-05 (×3): 1 [drp] via OPHTHALMIC

## 2021-05-05 MED ORDER — TETRACAINE HCL 0.5 % OP SOLN
1.0000 [drp] | OPHTHALMIC | Status: DC | PRN
  Administered 2021-05-05 (×3): 1 [drp] via OPHTHALMIC

## 2021-05-05 MED ORDER — SIGHTPATH DOSE#1 BSS IO SOLN
INTRAOCULAR | Status: DC | PRN
  Administered 2021-05-05: 15 mL

## 2021-05-05 MED ORDER — OXYCODONE HCL 5 MG PO TABS: 5.0000 mg | ORAL_TABLET | Freq: Once | ORAL | Status: DC | PRN

## 2021-05-05 SURGICAL SUPPLY — 14 items
CANNULA ANT/CHMB 27GA (MISCELLANEOUS) ×4 IMPLANT
DISSECTOR HYDRO NUCLEUS 50X22 (MISCELLANEOUS) ×2 IMPLANT
GLOVE SURG ENC TEXT LTX SZ7.5 (GLOVE) ×2 IMPLANT
GLOVE SURG SYN 8.5  E (GLOVE) ×1
GLOVE SURG SYN 8.5 E (GLOVE) ×1 IMPLANT
GOWN STRL REUS W/ TWL LRG LVL3 (GOWN DISPOSABLE) ×2 IMPLANT
GOWN STRL REUS W/TWL LRG LVL3 (GOWN DISPOSABLE) ×4
LENS IOL TECNIS EYHANCE 16.0 (Intraocular Lens) ×2 IMPLANT
MARKER SKIN DUAL TIP RULER LAB (MISCELLANEOUS) ×2 IMPLANT
PACK EYE AFTER SURG (MISCELLANEOUS) ×2 IMPLANT
SYR 3ML LL SCALE MARK (SYRINGE) ×2 IMPLANT
SYR TB 1ML LUER SLIP (SYRINGE) ×2 IMPLANT
WATER STERILE IRR 250ML POUR (IV SOLUTION) ×2 IMPLANT
WIPE NON LINTING 3.25X3.25 (MISCELLANEOUS) ×2 IMPLANT

## 2021-05-05 NOTE — H&P (Signed)
[facility]   Primary Care Physician:  [name], MD Ophthalmologist: Dr. Willey Blade  Pre-Procedure History & Physical: HPI:  Anne Woodard is a 65 y.o. female here for cataract surgery.   Past Medical History:  Diagnosis Date   Abnormal EKG 01/14/2018   Arthritis    Right hip   Depression    Diabetes mellitus type 2 in obese (HCC) 01/14/2018   Motion sickness    Boats   Multilevel degenerative disc disease    Pure hypercholesterolemia 01/14/2018   Vertigo     Past Surgical History:  Procedure Laterality Date   CATARACT EXTRACTION W/PHACO Right 04/21/2021   Procedure: CATARACT EXTRACTION PHACO AND INTRAOCULAR LENS PLACEMENT (IOC) RIGHT DIABETIC;  Surgeon: Nevada Crane, MD;  Location: [facility];  Service: Ophthalmology;  Laterality: Right;  1.85 00:18.0   PARTIAL HYSTERECTOMY  2000   cervical cancer    Prior to Admission medications   Medication Sig Start Date End Date Taking? Authorizing Provider  Ascorbic Acid (VITAMIN C PO) Take by mouth. Take one tablet daily   Yes [provider]  atorvastatin (LIPITOR) 10 MG tablet Take 10 mg by mouth daily.   Yes [provider]  clonazePAM (KLONOPIN) 1 MG tablet TAKE 1/2 TABLET BY MOUTH TWICE DAILY AS NEEDED 03/14/21  Yes [name], MD  dapagliflozin propanediol (FARXIGA) 10 MG TABS tablet Take 1 tablet (10 mg total) by mouth daily. 04/14/21  Yes [name], MD  doxylamine, Sleep, (UNISOM) 25 MG tablet Take 25 mg by mouth at bedtime as needed.   Yes [provider]  escitalopram (LEXAPRO) 10 MG tablet Take 1 tablet (10 mg total) by mouth daily. 07/11/20  Yes [name], MD  losartan (COZAAR) 25 MG tablet Take 1 tablet (25 mg total) by mouth daily. 04/11/20  Yes [name], MD  Multiple Vitamin (MULTIVITAMIN) tablet Take 1 tablet by mouth daily.   Yes [provider]  Vitamin D, Ergocalciferol, 50 MCG (2000 UT) CAPS Take 2,000 Units by mouth daily. Every once  and a while   Yes [provider]  glucose blood (ONETOUCH VERIO) test strip Use as directed to check blood sugars daily dx: e11.69 04/11/20   [name], MD  OneTouch Delica Lancets 33G MISC Use as directed to check blood sugars daily dx: e11.69 04/11/20   [name], MD    Allergies as of 03/05/2021   (No Known Allergies)    Family History  Problem Relation Age of Onset   Colon cancer Mother    Endometrial cancer Mother    Hypertension Mother    Alzheimer's disease Father    Multiple sclerosis Son    Hypertension Son    Depression Son    Anxiety disorder Son    Breast cancer Neg Hx     Social History   Socioeconomic History   Marital status: Married    Spouse name: Not on file   Number of children: Not on file   Years of education: Not on file   Highest education level: Not on file  Occupational History   Not on file  Tobacco Use   Smoking status: Never   Smokeless tobacco: Never   Tobacco comments:    Not applicable.   Vaping Use   Vaping Use: Never used  Substance and Sexual Activity   Alcohol use: No   Drug use: Not Currently   Sexual activity: Not on file  Other Topics Concern   Not on file  Social History Narrative   Not on file   Social Determinants of Health   Financial Resource Strain: Low Risk    Difficulty of Paying Living Expenses: Not hard at all  Food Insecurity: No Food Insecurity   Worried About Programme researcher, broadcasting/film/video in the Last Year: Never true   Barista in the Last Year: Never true  Transportation Needs: No Transportation Needs   Lack of Transportation (Medical): No   Lack of Transportation (Non-Medical): No  Physical Activity: Inactive   Days of Exercise per Week: 0 days   Minutes of Exercise per Session: 0 min  Stress: No Stress Concern Present   Feeling of Stress : Not at all  Social Connections: Not on file  Intimate Partner Violence: Not on file    Review of Systems: See HPI, otherwise negative  ROS  Physical Exam: BP 129/89   Pulse 75   Temp (!) 97.3 F (36.3 C) (Temporal)   Resp 16   Ht 5\' 3"  (1.6 m)   Wt 88.5 kg   SpO2 98%   BMI 34.54 kg/m  General:   Alert, cooperative in NAD Head:  Normocephalic and atraumatic. Respiratory:  Normal work of breathing. Cardiovascular:  RRR  Impression/Plan: Anne Woodard is here for cataract surgery.  Risks, benefits, limitations, and alternatives regarding cataract surgery have been reviewed with the patient.  Questions have been answered.  All parties agreeable.   Geroge Baseman, MD  05/05/2021, 7:18 AM

## 2021-05-05 NOTE — Op Note (Signed)
OPERATIVE NOTE  Anne Woodard [id number] 05/05/2021   PREOPERATIVE DIAGNOSIS:  Nuclear sclerotic cataract left eye.  H25.12   POSTOPERATIVE DIAGNOSIS:    Nuclear sclerotic cataract left eye.     PROCEDURE:  Phacoemusification with posterior chamber intraocular lens placement of the left eye   LENS:   Implant Name Type Inv. Item Serial No. Manufacturer Lot No. LRB No. Used Action  LENS IOL TECNIS EYHANCE 16.0 - [id number] Intraocular Lens LENS IOL TECNIS EYHANCE 16.0 [id number] JOHNSON   Left 1 Implanted      Procedure(s): CATARACT EXTRACTION PHACO AND INTRAOCULAR LENS PLACEMENT (IOC) LEFT DIABETIC 2.31 00:21.0 (Left)  DIB00 +16.0   ULTRASOUND TIME: 0 minutes 21 seconds.  CDE 2.31   SURGEON:  Willey Blade, MD, MPH   ANESTHESIA:  Topical with tetracaine drops augmented with 1% preservative-free intracameral lidocaine.  ESTIMATED BLOOD LOSS: <1 mL   COMPLICATIONS:  None.   DESCRIPTION OF PROCEDURE:  The patient was identified in the holding room and transported to the operating room and placed in the supine position under the operating microscope.  The left eye was identified as the operative eye and it was prepped and draped in the usual sterile ophthalmic fashion.   A 1.0 millimeter clear-corneal paracentesis was made at the 5:00 position. 0.5 ml of preservative-free 1% lidocaine with epinephrine was injected into the anterior chamber.  The anterior chamber was filled with Healon 5 viscoelastic.  A 2.4 millimeter keratome was used to make a near-clear corneal incision at the 2:00 position.  A curvilinear capsulorrhexis was made with a cystotome and capsulorrhexis forceps.  Balanced salt solution was used to hydrodissect and hydrodelineate the nucleus.   Phacoemulsification was then used in stop and chop fashion to remove the lens nucleus and epinucleus.  The remaining cortex was then removed using the irrigation and aspiration handpiece. Healon was then placed into the capsular bag  to distend it for lens placement.  A lens was then injected into the capsular bag.  The remaining viscoelastic was aspirated.   Wounds were hydrated with balanced salt solution.  The anterior chamber was inflated to a physiologic pressure with balanced salt solution.  Intracameral vigamox 0.1 mL undiltued was injected into the eye and a drop placed onto the ocular surface.  No wound leaks were noted.  The patient was taken to the recovery room in stable condition without complications of anesthesia or surgery  Willey Blade 05/05/2021, 7:53 AM

## 2021-05-05 NOTE — Anesthesia Preprocedure Evaluation (Signed)
Anesthesia Evaluation  Patient identified by MRN, date of birth, ID band Patient awake    Reviewed: Allergy & Precautions, H&P , NPO status , Patient's Chart, lab work & pertinent test results  Airway Mallampati: II  TM Distance: >3 FB Neck ROM: full    Dental no notable dental hx.    Pulmonary    Pulmonary exam normal breath sounds clear to auscultation       Cardiovascular Normal cardiovascular exam Rhythm:regular Rate:Normal     Neuro/Psych Depression    GI/Hepatic   Endo/Other  diabetesObesity  Renal/GU      Musculoskeletal   Abdominal   Peds  Hematology   Anesthesia Other Findings   Reproductive/Obstetrics                             Anesthesia Physical Anesthesia Plan  ASA: 2  Anesthesia Plan: MAC   Post-op Pain Management:    Induction:   PONV Risk Score and Plan: 2 and Treatment may vary due to age or medical condition, TIVA and Midazolam  Airway Management Planned:   Additional Equipment:   Intra-op Plan:   Post-operative Plan:   Informed Consent: I have reviewed the patients History and Physical, chart, labs and discussed the procedure including the risks, benefits and alternatives for the proposed anesthesia with the patient or authorized representative who has indicated his/her understanding and acceptance.     Dental Advisory Given  Plan Discussed with: CRNA  Anesthesia Plan Comments:         Anesthesia Quick Evaluation  

## 2021-05-05 NOTE — Anesthesia Postprocedure Evaluation (Signed)
Anesthesia Post Note  Patient: Anne Woodard  Procedure(s) Performed: CATARACT EXTRACTION PHACO AND INTRAOCULAR LENS PLACEMENT (IOC) LEFT DIABETIC 2.31 00:21.0 (Left: Eye)     Patient location during evaluation: PACU Anesthesia Type: MAC Level of consciousness: awake and alert Pain management: pain level controlled Vital Signs Assessment: post-procedure vital signs reviewed and stable Respiratory status: spontaneous breathing, nonlabored ventilation, respiratory function stable and patient connected to nasal cannula oxygen Cardiovascular status: stable and blood pressure returned to baseline Postop Assessment: no apparent nausea or vomiting Anesthetic complications: no   No notable events documented.  Markela Wee, Glade Stanford

## 2021-05-05 NOTE — Transfer of Care (Signed)
Immediate Anesthesia Transfer of Care Note  Patient: Anne Woodard  Procedure(s) Performed: CATARACT EXTRACTION PHACO AND INTRAOCULAR LENS PLACEMENT (IOC) LEFT DIABETIC 2.31 00:21.0 (Left: Eye)  Patient Location: PACU  Anesthesia Type: MAC  Level of Consciousness: awake, alert  and patient cooperative  Airway and Oxygen Therapy: Patient Spontanous Breathing and Patient connected to supplemental oxygen  Post-op Assessment: Post-op Vital signs reviewed, Patient's Cardiovascular Status Stable, Respiratory Function Stable, Patent Airway and No signs of Nausea or vomiting  Post-op Vital Signs: Reviewed and stable  Complications: No notable events documented.

## 2021-05-06 ENCOUNTER — Encounter: Payer: Self-pay | Admitting: Ophthalmology

## 2021-05-07 ENCOUNTER — Other Ambulatory Visit: Payer: Self-pay | Admitting: Internal Medicine

## 2021-05-07 LAB — CYTOLOGY - PAP
Comment: NEGATIVE
Diagnosis: NEGATIVE
High risk HPV: NEGATIVE

## 2021-05-08 ENCOUNTER — Other Ambulatory Visit: Payer: Self-pay | Admitting: Internal Medicine

## 2021-05-11 ENCOUNTER — Other Ambulatory Visit: Payer: Self-pay | Admitting: Internal Medicine

## 2021-05-11 MED ORDER — CLONAZEPAM 1 MG PO TABS: ORAL_TABLET | ORAL | 0 refills | Status: DC

## 2021-05-15 ENCOUNTER — Encounter: Payer: Self-pay | Admitting: Internal Medicine

## 2021-05-15 DIAGNOSIS — M13851 Other specified arthritis, right hip: Secondary | ICD-10-CM | POA: Diagnosis not present

## 2021-05-15 DIAGNOSIS — M25551 Pain in right hip: Secondary | ICD-10-CM | POA: Diagnosis not present

## 2021-05-25 ENCOUNTER — Other Ambulatory Visit: Payer: Self-pay | Admitting: Internal Medicine

## 2021-06-02 DIAGNOSIS — M13851 Other specified arthritis, right hip: Secondary | ICD-10-CM | POA: Diagnosis not present

## 2021-06-02 DIAGNOSIS — S39012A Strain of muscle, fascia and tendon of lower back, initial encounter: Secondary | ICD-10-CM | POA: Diagnosis not present

## 2021-06-10 ENCOUNTER — Encounter: Payer: Self-pay | Admitting: Internal Medicine

## 2021-06-10 DIAGNOSIS — Z800 Family history of malignant neoplasm of digestive organs: Secondary | ICD-10-CM | POA: Diagnosis not present

## 2021-06-10 DIAGNOSIS — K900 Celiac disease: Secondary | ICD-10-CM | POA: Diagnosis not present

## 2021-06-10 DIAGNOSIS — Z1211 Encounter for screening for malignant neoplasm of colon: Secondary | ICD-10-CM | POA: Diagnosis not present

## 2021-06-10 DIAGNOSIS — Z961 Presence of intraocular lens: Secondary | ICD-10-CM | POA: Diagnosis not present

## 2021-06-24 ENCOUNTER — Encounter: Payer: Self-pay | Admitting: Obstetrics and Gynecology

## 2021-07-08 ENCOUNTER — Other Ambulatory Visit: Payer: Self-pay | Admitting: Internal Medicine

## 2021-07-17 ENCOUNTER — Ambulatory Visit: Payer: Medicare Other | Admitting: Internal Medicine

## 2021-07-17 ENCOUNTER — Ambulatory Visit (INDEPENDENT_AMBULATORY_CARE_PROVIDER_SITE_OTHER): Payer: Medicare Other

## 2021-07-17 VITALS — Ht 63.0 in | Wt 194.0 lb

## 2021-07-17 DIAGNOSIS — Z0000 Encounter for general adult medical examination without abnormal findings: Secondary | ICD-10-CM

## 2021-07-17 NOTE — Patient Instructions (Signed)
Anne Woodard , Thank you for taking time to come for your Medicare Wellness Visit. I appreciate your ongoing commitment to your health goals. Please review the following plan we discussed and let me know if I can assist you in the future.   Screening recommendations/referrals: Colonoscopy: has appt. With Dr. Loreta Ave in Nov. Mammogram: patient to schedule Bone Density: completed 05/13/2020 Recommended yearly ophthalmology/optometry visit for glaucoma screening and checkup Recommended yearly dental visit for hygiene and checkup  Vaccinations: Influenza vaccine: due Pneumococcal vaccine: completed 06/05/2018 Tdap vaccine: completed 04/29/2017, due 04/30/2027 Shingles vaccine: discussed   Covid-19: 11/01/2020, 01/24/2020, 12/29/2019  Advanced directives: Advance directive discussed with you today.   Conditions/risks identified: none  Next appointment: Follow up in one year for your annual wellness visit    Preventive Care 65 Years and Older, Female Preventive care refers to lifestyle choices and visits with your health care provider that can promote health and wellness. What does preventive care include? A yearly physical exam. This is also called an annual well check. Dental exams once or twice a year. Routine eye exams. Ask your health care provider how often you should have your eyes checked. Personal lifestyle choices, including: Daily care of your teeth and gums. Regular physical activity. Eating a healthy diet. Avoiding tobacco and drug use. Limiting alcohol use. Practicing safe sex. Taking low-dose aspirin every day. Taking vitamin and mineral supplements as recommended by your health care provider. What happens during an annual well check? The services and screenings done by your health care provider during your annual well check will depend on your age, overall health, lifestyle risk factors, and family history of disease. Counseling  Your health care provider may ask you questions  about your: Alcohol use. Tobacco use. Drug use. Emotional well-being. Home and relationship well-being. Sexual activity. Eating habits. History of falls. Memory and ability to understand (cognition). Work and work Astronomer. Reproductive health. Screening  You may have the following tests or measurements: Height, weight, and BMI. Blood pressure. Lipid and cholesterol levels. These may be checked every 5 years, or more frequently if you are over 70 years old. Skin check. Lung cancer screening. You may have this screening every year starting at age 2 if you have a 30-pack-year history of smoking and currently smoke or have quit within the past 15 years. Fecal occult blood test (FOBT) of the stool. You may have this test every year starting at age 67. Flexible sigmoidoscopy or colonoscopy. You may have a sigmoidoscopy every 5 years or a colonoscopy every 10 years starting at age 81. Hepatitis C blood test. Hepatitis B blood test. Sexually transmitted disease (STD) testing. Diabetes screening. This is done by checking your blood sugar (glucose) after you have not eaten for a while (fasting). You may have this done every 1-3 years. Bone density scan. This is done to screen for osteoporosis. You may have this done starting at age 90. Mammogram. This may be done every 1-2 years. Talk to your health care provider about how often you should have regular mammograms. Talk with your health care provider about your test results, treatment options, and if necessary, the need for more tests. Vaccines  Your health care provider may recommend certain vaccines, such as: Influenza vaccine. This is recommended every year. Tetanus, diphtheria, and acellular pertussis (Tdap, Td) vaccine. You may need a Td booster every 10 years. Zoster vaccine. You may need this after age 54. Pneumococcal 13-valent conjugate (PCV13) vaccine. One dose is recommended after age 76. Pneumococcal  polysaccharide (PPSV23)  vaccine. One dose is recommended after age 74. Talk to your health care provider about which screenings and vaccines you need and how often you need them. This information is not intended to replace advice given to you by your health care provider. Make sure you discuss any questions you have with your health care provider. Document Released: 10/18/2015 Document Revised: 06/10/2016 Document Reviewed: 07/23/2015 Elsevier Interactive Patient Education  2017 [place] Prevention in the Home Falls can cause injuries. They can happen to people of all ages. There are many things you can do to make your home safe and to help prevent falls. What can I do on the outside of my home? Regularly fix the edges of walkways and driveways and fix any cracks. Remove anything that might make you trip as you walk through a door, such as a raised step or threshold. Trim any bushes or trees on the path to your home. Use bright outdoor lighting. Clear any walking paths of anything that might make someone trip, such as rocks or tools. Regularly check to see if handrails are loose or broken. Make sure that both sides of any steps have handrails. Any raised decks and porches should have guardrails on the edges. Have any leaves, snow, or ice cleared regularly. Use sand or salt on walking paths during winter. Clean up any spills in your garage right away. This includes oil or grease spills. What can I do in the bathroom? Use night lights. Install grab bars by the toilet and in the tub and shower. Do not use towel bars as grab bars. Use non-skid mats or decals in the tub or shower. If you need to sit down in the shower, use a plastic, non-slip stool. Keep the floor dry. Clean up any water that spills on the floor as soon as it happens. Remove soap buildup in the tub or shower regularly. Attach bath mats securely with double-sided non-slip rug tape. Do not have throw rugs and other things on the floor that  can make you trip. What can I do in the bedroom? Use night lights. Make sure that you have a light by your bed that is easy to reach. Do not use any sheets or blankets that are too big for your bed. They should not hang down onto the floor. Have a firm chair that has side arms. You can use this for support while you get dressed. Do not have throw rugs and other things on the floor that can make you trip. What can I do in the kitchen? Clean up any spills right away. Avoid walking on wet floors. Keep items that you use a lot in easy-to-reach places. If you need to reach something above you, use a strong step stool that has a grab bar. Keep electrical cords out of the way. Do not use floor polish or wax that makes floors slippery. If you must use wax, use non-skid floor wax. Do not have throw rugs and other things on the floor that can make you trip. What can I do with my stairs? Do not leave any items on the stairs. Make sure that there are handrails on both sides of the stairs and use them. Fix handrails that are broken or loose. Make sure that handrails are as long as the stairways. Check any carpeting to make sure that it is firmly attached to the stairs. Fix any carpet that is loose or worn. Avoid having throw rugs at the top  or bottom of the stairs. If you do have throw rugs, attach them to the floor with carpet tape. Make sure that you have a light switch at the top of the stairs and the bottom of the stairs. If you do not have them, ask someone to add them for you. What else can I do to help prevent falls? Wear shoes that: Do not have high heels. Have rubber bottoms. Are comfortable and fit you well. Are closed at the toe. Do not wear sandals. If you use a stepladder: Make sure that it is fully opened. Do not climb a closed stepladder. Make sure that both sides of the stepladder are locked into place. Ask someone to hold it for you, if possible. Clearly mark and make sure that you  can see: Any grab bars or handrails. First and last steps. Where the edge of each step is. Use tools that help you move around (mobility aids) if they are needed. These include: Canes. Walkers. Scooters. Crutches. Turn on the lights when you go into a dark area. Replace any light bulbs as soon as they burn out. Set up your furniture so you have a clear path. Avoid moving your furniture around. If any of your floors are uneven, fix them. If there are any pets around you, be aware of where they are. Review your medicines with your doctor. Some medicines can make you feel dizzy. This can increase your chance of falling. Ask your doctor what other things that you can do to help prevent falls. This information is not intended to replace advice given to you by your health care provider. Make sure you discuss any questions you have with your health care provider. Document Released: 07/18/2009 Document Revised: 02/27/2016 Document Reviewed: 10/26/2014 Elsevier Interactive Patient Education  2017 [employer].

## 2021-07-17 NOTE — Progress Notes (Signed)
I connected with Anne Woodard today by telephone and verified that I am speaking with the correct person using two identifiers. Location patient: home Location provider: work Persons participating in the virtual visit: [name], [name].   I discussed the limitations, risks, security and privacy concerns of performing an evaluation and management service by telephone and the availability of in person appointments. I also discussed with the patient that there may be a patient responsible charge related to this service. The patient expressed understanding and verbally consented to this telephonic visit.    Interactive audio and video telecommunications were attempted between this provider and patient, however failed, due to patient having technical difficulties OR patient did not have access to video capability.  We continued and completed visit with audio only.     Vital signs may be patient reported or missing.  Subjective:   Anne Woodard is a 65 y.o. female who presents for Medicare Annual (Subsequent) preventive examination.  Review of Systems     Cardiac Risk Factors include: advanced age (>26men, >40 women);diabetes mellitus;obesity (BMI >30kg/m2)     Objective:    Today's Vitals   07/17/21 1431  Weight: 194 lb (88 kg)  Height: 5\' 3"  (1.6 m)   Body mass index is 34.37 kg/m.  Advanced Directives 07/17/2021 05/05/2021 04/21/2021 07/11/2020 06/27/2019 06/29/2018 07/02/2016  Does Patient Have a Medical Advance Directive? No No No No No No No  Would patient like information on creating a medical advance directive? - No - Patient declined Yes (MAU/Ambulatory/Procedural Areas - Information given) Yes (MAU/Ambulatory/Procedural Areas - Information given) - No - Patient declined No - patient declined information    Current Medications (verified) Outpatient Encounter Medications as of 07/17/2021  Medication Sig   Ascorbic Acid (VITAMIN C PO) Take by mouth. Take one tablet  daily   atorvastatin (LIPITOR) 10 MG tablet Take 10 mg by mouth daily.   clonazePAM (KLONOPIN) 1 MG tablet One tab po qd prn   dapagliflozin propanediol (FARXIGA) 10 MG TABS tablet Take 1 tablet (10 mg total) by mouth daily.   doxylamine, Sleep, (UNISOM) 25 MG tablet Take 25 mg by mouth at bedtime as needed.   escitalopram (LEXAPRO) 10 MG tablet TAKE 1 TABLET BY MOUTH EVERY DAY   losartan (COZAAR) 25 MG tablet TAKE 1 TABLET BY MOUTH EVERY DAY   Multiple Vitamin (MULTIVITAMIN) tablet Take 1 tablet by mouth daily.   rosuvastatin (CRESTOR) 10 MG tablet TAKE 1 TABLET BY MOUTH EVERY DAY   glucose blood (ONETOUCH VERIO) test strip Use as directed to check blood sugars daily dx: e11.69 (Patient not taking: Reported on 07/17/2021)   OneTouch Delica Lancets 33G MISC Use as directed to check blood sugars daily dx: e11.69 (Patient not taking: Reported on 07/17/2021)   Vitamin D, Ergocalciferol, 50 MCG (2000 UT) CAPS Take 2,000 Units by mouth daily. Every once and a while (Patient not taking: Reported on 07/17/2021)   No facility-administered encounter medications on file as of 07/17/2021.    Allergies (verified) Patient has no known allergies.   History: Past Medical History:  Diagnosis Date   Abnormal EKG 01/14/2018   Arthritis    Right hip   Depression    Diabetes mellitus type 2 in obese (HCC) 01/14/2018   Motion sickness    Boats   Multilevel degenerative disc disease    Pure hypercholesterolemia 01/14/2018   Vertigo    Past Surgical History:  Procedure Laterality Date   CATARACT EXTRACTION W/PHACO Right 04/21/2021  Procedure: CATARACT EXTRACTION PHACO AND INTRAOCULAR LENS PLACEMENT (IOC) RIGHT DIABETIC;  Surgeon: [place] Crane, MD;  Location: [facility];  Service: Ophthalmology;  Laterality: Right;  1.85 00:18.0   CATARACT EXTRACTION W/PHACO Left 05/05/2021   Procedure: CATARACT EXTRACTION PHACO AND INTRAOCULAR LENS PLACEMENT (IOC) LEFT DIABETIC 2.31 00:21.0;  Surgeon:  [place] Crane, MD;  Location: [facility];  Service: Ophthalmology;  Laterality: Left;   PARTIAL HYSTERECTOMY  2000   cervical cancer   Family History  Problem Relation Age of Onset   Colon cancer Mother    Endometrial cancer Mother    Hypertension Mother    Alzheimer's disease Father    Multiple sclerosis Son    Hypertension Son    Depression Son    Anxiety disorder Son    Breast cancer Neg Hx    Social History   Socioeconomic History   Marital status: Married    Spouse name: Not on file   Number of children: Not on file   Years of education: Not on file   Highest education level: Not on file  Occupational History   Not on file  Tobacco Use   Smoking status: Never   Smokeless tobacco: Never   Tobacco comments:    Not applicable.   Vaping Use   Vaping Use: Never used  Substance and Sexual Activity   Alcohol use: No   Drug use: Not Currently   Sexual activity: Not on file  Other Topics Concern   Not on file  Social History Narrative   Not on file   Social Determinants of Health   Financial Resource Strain: Low Risk    Difficulty of Paying Living Expenses: Not hard at all  Food Insecurity: No Food Insecurity   Worried About Programme researcher, broadcasting/film/video in the Last Year: Never true   Ran Out of Food in the Last Year: Never true  Transportation Needs: No Transportation Needs   Lack of Transportation (Medical): No   Lack of Transportation (Non-Medical): No  Physical Activity: Insufficiently Active   Days of Exercise per Week: 3 days   Minutes of Exercise per Session: 30 min  Stress: No Stress Concern Present   Feeling of Stress : Not at all  Social Connections: Not on file    Tobacco Counseling Counseling given: Not Answered Tobacco comments: Not applicable.    Clinical Intake:  Pre-visit preparation completed: Yes  Pain : No/denies pain     Nutritional Status: BMI > 30  Obese Nutritional Risks: None Diabetes: Yes  How often do you need to  have someone help you when you read instructions, pamphlets, or other written materials from your doctor or pharmacy?: 1 - Never What is the last grade level you completed in school?: masters degree  Diabetic? Yes Nutrition Risk Assessment:  Has the patient had any N/V/D within the last 2 months?  No  Does the patient have any non-healing wounds?  No  Has the patient had any unintentional weight loss or weight gain?  No   Diabetes:  Is the patient diabetic?  Yes  If diabetic, was a CBG obtained today?  No  Did the patient bring in their glucometer from home?  No  How often do you monitor your CBG's? Does not.   Financial Strains and Diabetes Management:  Are you having any financial strains with the device, your supplies or your medication? No .  Does the patient want to be seen by Chronic Care Management for management of  their diabetes?  No  Would the patient like to be referred to a Nutritionist or for Diabetic Management?  No   Diabetic Exams:  Diabetic Eye Exam: Completed 02/28/2021 Diabetic Foot Exam: Overdue, Pt has been advised about the importance in completing this exam. Pt is scheduled for diabetic foot exam on next appointment.   Interpreter Needed?: No  Information entered by :: NAllen LPN   Activities of Daily Living In your present state of health, do you have any difficulty performing the following activities: 07/17/2021 05/05/2021  Hearing? N N  Vision? N N  Difficulty concentrating or making decisions? N N  Walking or climbing stairs? N N  Dressing or bathing? N N  Doing errands, shopping? N -  Preparing Food and eating ? N -  Using the Toilet? N -  In the past six months, have you accidently leaked urine? N -  Do you have problems with loss of bowel control? N -  Managing your Medications? N -  Managing your Finances? N -  Housekeeping or managing your Housekeeping? N -  Some recent data might be hidden    Patient Care Team: [name], MD as  PCP - General (Internal Medicine)  Indicate any recent Medical Services you may have received from other than [facility] providers in the past year (date may be approximate).     Assessment:   This is a routine wellness examination for [place].  Hearing/Vision screen Vision Screening - Comments:: Regular eye exams, [facility]  Dietary issues and exercise activities discussed: Current Exercise Habits: Home exercise routine, Type of exercise: stretching;Other - see comments (stationary bike), Time (Minutes): 30, Frequency (Times/Week): 3, Weekly Exercise (Minutes/Week): 90   Goals Addressed             This Visit's Progress    Patient Stated       07/17/2021, maintain exercise and lose weight (wants to weigh 185 pounds)       Depression Screen PHQ 2/9 Scores 07/17/2021 07/11/2020 12/07/2019 06/27/2019 10/15/2018 09/27/2018  PHQ - 2 Score 0 0 0 0 2 0  PHQ- 9 Score - - - - 2 -    Fall Risk Fall Risk  07/17/2021 07/11/2020 12/07/2019 06/27/2019  Falls in the past year? 0 0 0 0  Risk for fall due to : Medication side effect Medication side effect - -  Follow up Falls evaluation completed;Education provided;Falls prevention discussed Falls evaluation completed;Education provided;Falls prevention discussed - -    FALL RISK PREVENTION PERTAINING TO THE HOME:  Any stairs in or around the home? Yes  If so, are there any without handrails? No  Home free of loose throw rugs in walkways, pet beds, electrical cords, etc? Yes  Adequate lighting in your home to reduce risk of falls? Yes   ASSISTIVE DEVICES UTILIZED TO PREVENT FALLS:  Life alert? No  Use of a cane, walker or w/c? No  Grab bars in the bathroom? No  Shower chair or bench in shower? Yes  Elevated toilet seat or a handicapped toilet? No   TIMED UP AND GO:  Was the test performed? No .      Cognitive Function:     6CIT Screen 07/17/2021 07/11/2020 06/27/2019 06/27/2019  What Year? 0 points 0 points 0 points 0 points   What month? 0 points 0 points 0 points 0 points  What time? 0 points 0 points 0 points 0 points  Count back from 20 0 points 0 points 0 points 0  points  Months in reverse 0 points 0 points 0 points 0 points  Repeat phrase 0 points 0 points 0 points 0 points  Total Score 0 0 0 0    Immunizations Immunization History  Administered Date(s) Administered   Fluad Quad(high Dose 65+) 07/11/2020   Influenza Inj Mdck Quad Pf 07/29/2018   Influenza, High Dose Seasonal PF 06/27/2019   Influenza,inj,Quad PF,6+ Mos 07/15/2017   Influenza-Unspecified 06/19/2018   PFIZER Comirnaty(Gray Top)Covid-19 Tri-Sucrose Vaccine 11/01/2020   PFIZER(Purple Top)SARS-COV-2 Vaccination 12/29/2019, 01/24/2020, 11/01/2020   Pneumococcal Polysaccharide-23 11/09/2015    TDAP status: Up to date  Flu Vaccine status: Due, Education has been provided regarding the importance of this vaccine. Advised may receive this vaccine at local pharmacy or Health Dept. Aware to provide a copy of the vaccination record if obtained from local pharmacy or Health Dept. Verbalized acceptance and understanding.  Pneumococcal vaccine status: Up to date  Covid-19 vaccine status: Completed vaccines  Qualifies for Shingles Vaccine? Yes   Zostavax completed No   Shingrix Completed?: No.    Education has been provided regarding the importance of this vaccine. Patient has been advised to call insurance company to determine out of pocket expense if they have not yet received this vaccine. Advised may also receive vaccine at local pharmacy or Health Dept. Verbalized acceptance and understanding.  Screening Tests Health Maintenance  Topic Date Due   Zoster Vaccines- Shingrix (1 of 2) Never done   COVID-19 Vaccine (4 - Booster for Pfizer series) 01/24/2021   INFLUENZA VACCINE  05/05/2021   FOOT EXAM  07/11/2021   HEMOGLOBIN A1C  09/09/2021   OPHTHALMOLOGY EXAM  02/28/2022   MAMMOGRAM  05/13/2022   COLONOSCOPY (Pts 45-2yrs Insurance  coverage will need to be confirmed)  01/27/2024   TETANUS/TDAP  04/30/2027   DEXA SCAN  Completed   Hepatitis C Screening  Completed   HPV VACCINES  Aged Out    Health Maintenance  Health Maintenance Due  Topic Date Due   Zoster Vaccines- Shingrix (1 of 2) Never done   COVID-19 Vaccine (4 - Booster for Pfizer series) 01/24/2021   INFLUENZA VACCINE  05/05/2021   FOOT EXAM  07/11/2021    Colorectal cancer screening: appointment with Dr. Loreta Ave in November  Mammogram status: patient to schedule  Bone Density status: Completed 05/13/2020.   Lung Cancer Screening: (Low Dose CT Chest recommended if Age 4-80 years, 30 pack-year currently smoking OR have quit w/in 15years.) does not qualify.   Lung Cancer Screening Referral: no  Additional Screening:  Hepatitis C Screening: does qualify; Completed 10/15/2018  Vision Screening: Recommended annual ophthalmology exams for early detection of glaucoma and other disorders of the eye. Is the patient up to date with their annual eye exam?  Yes  Who is the provider or what is the name of the office in which the patient attends annual eye exams? [facility] If pt is not established with a provider, would they like to be referred to a provider to establish care? No .   Dental Screening: Recommended annual dental exams for proper oral hygiene  Community Resource Referral / Chronic Care Management: CRR required this visit?  No   CCM required this visit?  No      Plan:     I have personally reviewed and noted the following in the patient's chart:   Medical and social history Use of alcohol, tobacco or illicit drugs  Current medications and supplements including opioid prescriptions.  Functional ability and  status Nutritional status Physical activity Advanced directives List of other physicians Hospitalizations, surgeries, and ER visits in previous 12 months Vitals Screenings to include cognitive, depression, and  falls Referrals and appointments  In addition, I have reviewed and discussed with patient certain preventive protocols, quality metrics, and best practice recommendations. A written personalized care plan for preventive services as well as general preventive health recommendations were provided to patient.     Barb Merino, LPN   [id number]   Nurse Notes:

## 2021-07-28 ENCOUNTER — Other Ambulatory Visit: Payer: Self-pay | Admitting: Internal Medicine

## 2021-07-31 ENCOUNTER — Other Ambulatory Visit: Payer: Self-pay | Admitting: Internal Medicine

## 2021-07-31 MED ORDER — CLONAZEPAM 1 MG PO TABS: ORAL_TABLET | ORAL | 0 refills | Status: DC

## 2021-08-06 ENCOUNTER — Other Ambulatory Visit: Payer: Self-pay

## 2021-08-06 ENCOUNTER — Encounter: Payer: Self-pay | Admitting: Nurse Practitioner

## 2021-08-06 ENCOUNTER — Ambulatory Visit (INDEPENDENT_AMBULATORY_CARE_PROVIDER_SITE_OTHER): Payer: Medicare Other | Admitting: Nurse Practitioner

## 2021-08-06 ENCOUNTER — Other Ambulatory Visit: Payer: Self-pay | Admitting: Nurse Practitioner

## 2021-08-06 VITALS — BP 128/68 | HR 88 | Temp 98.8°F | Ht 62.2 in | Wt 192.6 lb

## 2021-08-06 DIAGNOSIS — T300 Burn of unspecified body region, unspecified degree: Secondary | ICD-10-CM | POA: Diagnosis not present

## 2021-08-06 DIAGNOSIS — Z6835 Body mass index (BMI) 35.0-35.9, adult: Secondary | ICD-10-CM

## 2021-08-06 DIAGNOSIS — X12XXXA Contact with other hot fluids, initial encounter: Secondary | ICD-10-CM | POA: Diagnosis not present

## 2021-08-06 DIAGNOSIS — E1165 Type 2 diabetes mellitus with hyperglycemia: Secondary | ICD-10-CM | POA: Diagnosis not present

## 2021-08-06 DIAGNOSIS — E7800 Pure hypercholesterolemia, unspecified: Secondary | ICD-10-CM | POA: Diagnosis not present

## 2021-08-06 DIAGNOSIS — Z23 Encounter for immunization: Secondary | ICD-10-CM | POA: Diagnosis not present

## 2021-08-06 MED ORDER — AZITHROMYCIN 250 MG PO TABS: ORAL_TABLET | ORAL | 0 refills | Status: AC

## 2021-08-06 MED ORDER — SILVER SULFADIAZINE 1 % EX CREA: 1.0000 "application " | TOPICAL_CREAM | Freq: Every day | CUTANEOUS | 0 refills | Status: DC

## 2021-08-06 MED ORDER — DAPAGLIFLOZIN PROPANEDIOL 10 MG PO TABS: 10.0000 mg | ORAL_TABLET | Freq: Every day | ORAL | 1 refills | Status: DC

## 2021-08-06 NOTE — Progress Notes (Signed)
I,Tianna Badgett,acting as a Education administrator for [employer], NP.,have documented all relevant documentation on the behalf of [employer], NP,as directed by  Bary Castilla, NP while in the presence of Bary Castilla, NP.  This visit occurred during the SARS-CoV-2 public health emergency.  Safety protocols were in place, including screening questions prior to the visit, additional usage of staff PPE, and extensive cleaning of exam room while observing appropriate contact time as indicated for disinfecting solutions.  Subjective:     Patient ID: Anne Woodard , female    DOB: 11/29/1955 , 65 y.o.   MRN: [id number]   Chief Complaint  Patient presents with   Diabetes    HPI  The patient is here today for a follow-up on her diabetes.  She reports compliance with meds. She is feeling well after starting Farxiga 32m. She has not had any issues with the medication. She has noticed an improvement in her blood sugars.   Diabetes She presents for her follow-up diabetic visit. She has type 2 diabetes mellitus. Her disease course has been stable. There are no hypoglycemic associated symptoms. Pertinent negatives for hypoglycemia include no headaches. Pertinent negatives for diabetes include no chest pain, no fatigue, no polydipsia, no polyphagia, no polyuria and no weakness. There are no hypoglycemic complications. Symptoms are worsening. Risk factors for coronary artery disease include diabetes mellitus, hypertension, obesity, sedentary lifestyle and post-menopausal. Her breakfast blood glucose range is generally 110-130 mg/dl. Eye exam is not current.    Past Medical History:  Diagnosis Date   Abnormal EKG 01/14/2018   Arthritis    Right hip   Depression    Diabetes mellitus type 2 [place] 01/14/2018   Motion sickness    Boats   Multilevel degenerative disc disease    Pure hypercholesterolemia 01/14/2018   Vertigo      Family History  Problem Relation Age of Onset   Colon  cancer Mother    Endometrial cancer Mother    Hypertension Mother    Alzheimer's disease Father    Multiple sclerosis Son    Hypertension Son    Depression Son    Anxiety disorder Son    Breast cancer Neg Hx      Current Outpatient Medications:    glucose blood (ONETOUCH VERIO) test strip, Use as directed to check blood sugars daily dx: e11.69, Disp: 100 each, Rfl: 2   silver sulfADIAZINE (SILVADENE) 1 % cream, Apply 1 application topically daily., Disp: 50 g, Rfl: 0   Ascorbic Acid (VITAMIN C PO), Take by mouth. Take one tablet daily, Disp: , Rfl:    atorvastatin (LIPITOR) 10 MG tablet, Take 10 mg by mouth daily., Disp: , Rfl:    clonazePAM (KLONOPIN) 1 MG tablet, One tab po qd prn, Disp: 30 tablet, Rfl: 0   dapagliflozin propanediol (FARXIGA) 10 MG TABS tablet, Take 1 tablet (10 mg total) by mouth daily., Disp: 90 tablet, Rfl: 1   doxylamine, Sleep, (UNISOM) 25 MG tablet, Take 25 mg by mouth at bedtime as needed., Disp: , Rfl:    escitalopram (LEXAPRO) 10 MG tablet, TAKE 1 TABLET BY MOUTH EVERY DAY, Disp: 90 tablet, Rfl: 2   losartan (COZAAR) 25 MG tablet, TAKE 1 TABLET BY MOUTH EVERY DAY, Disp: 90 tablet, Rfl: 2   Multiple Vitamin (MULTIVITAMIN) tablet, Take 1 tablet by mouth daily., Disp: , Rfl:    rosuvastatin (CRESTOR) 10 MG tablet, TAKE 1 TABLET BY MOUTH EVERY DAY, Disp: 90 tablet, Rfl: 1   No Known  Allergies   Review of Systems  Constitutional: Negative.  Negative for fatigue.  HENT:  Negative for congestion and sinus pressure.   Respiratory: Negative.  Negative for cough, shortness of breath and wheezing.   Cardiovascular: Negative.  Negative for chest pain and palpitations.  Gastrointestinal: Negative.  Negative for constipation and diarrhea.  Endocrine: Negative for polydipsia, polyphagia and polyuria.  Skin:  Positive for color change.       Burn to her chest  Neurological: Negative.  Negative for weakness and headaches.    Today's Vitals   08/06/21 1600  BP:  128/68  Pulse: 88  Temp: 98.8 F (37.1 C)  TempSrc: Oral  Weight: 192 lb 9.6 oz (87.4 kg)  Height: 5' 2.2" (1.58 m)   Body mass index is 35 kg/m.  Wt Readings from Last 3 Encounters:  08/06/21 192 lb 9.6 oz (87.4 kg)  07/17/21 194 lb (88 kg)  05/05/21 195 lb (88.5 kg)    Objective:  Physical Exam Constitutional:      Appearance: Normal appearance. She is obese.  HENT:     Head: Normocephalic and atraumatic.  Cardiovascular:     Rate and Rhythm: Normal rate and regular rhythm.     Pulses: Normal pulses.     Heart sounds: Normal heart sounds. No murmur heard. Pulmonary:     Effort: Pulmonary effort is normal. No respiratory distress.     Breath sounds: Normal breath sounds. No wheezing.  Skin:    General: Skin is warm and dry.     Capillary Refill: Capillary refill takes less than 2 seconds.     Findings: Burn present. No rash.     Comments: Burn to the chest wall in between her breasts. No blisters   Neurological:     Mental Status: She is alert and oriented to person, place, and time.        Assessment And Plan:     1. Uncontrolled type 2 diabetes mellitus with hyperglycemia (HCC) -Stable, continue meds -Advised patient to continue healthy life style.  - Hemoglobin A1c - CMP14+EGFR - CBC no Diff  2. Pure hypercholesterolemia -Chronic, stable, continue meds  - Lipid panel  3. Need for influenza vaccination - Flu Vaccine QUAD High Dose(Fluad)  4. Burn by hot liquid - silver sulfADIAZINE (SILVADENE) 1 % cream; Apply 1 application topically daily.  Dispense: 50 g; Refill: 0  5. Class 2 severe obesity due to excess calories with serious comorbidity and body mass index (BMI) of 35.0 to 35.9 in adult [facility]) -Advised patient on a healthy diet including avoiding fast food and red meats. Increase the intake of lean meats including grilled chicken and [place].  Drink a lot of water. Decrease intake of fatty foods. Exercise for 30-45 min. 4-5 a week to decrease the risk  of cardiac event.   The patient was encouraged to call or send a message through [place] for any questions or concerns.   Follow up: if symptoms persist or do not get better.   Side effects and appropriate use of all the medication(s) were discussed with the patient today. Patient advised to use the medication(s) as directed by their healthcare provider. The patient was encouraged to read, review, and understand all associated package inserts and contact our office with any questions or concerns. The patient accepts the risks of the treatment plan and had an opportunity to ask questions.   Patient was given opportunity to ask questions. Patient verbalized understanding of the plan and was able to  repeat key elements of the plan. All questions were answered to their satisfaction.  Raman Aaric Dolph, DNP   I, Raman Tarique Loveall have reviewed all documentation for this visit. The documentation on 08/06/21 for the exam, diagnosis, procedures, and orders are all accurate and complete.    IF YOU HAVE BEEN REFERRED TO A SPECIALIST, IT MAY TAKE 1-2 WEEKS TO SCHEDULE/PROCESS THE REFERRAL. IF YOU HAVE NOT HEARD FROM US/SPECIALIST IN TWO WEEKS, PLEASE GIVE [place] A CALL AT [phone].   THE PATIENT IS ENCOURAGED TO PRACTICE SOCIAL DISTANCING DUE TO THE COVID-19 PANDEMIC.

## 2021-08-06 NOTE — Patient Instructions (Signed)

## 2021-08-07 LAB — CMP14+EGFR
ALT: 40 IU/L — ABNORMAL HIGH (ref 0–32)
AST: 45 IU/L — ABNORMAL HIGH (ref 0–40)
Albumin/Globulin Ratio: 1.7 (ref 1.2–2.2)
Albumin: 4.6 g/dL (ref 3.8–4.8)
Alkaline Phosphatase: 113 IU/L (ref 44–121)
BUN/Creatinine Ratio: 16 (ref 12–28)
BUN: 10 mg/dL (ref 8–27)
Bilirubin Total: 0.4 mg/dL (ref 0.0–1.2)
CO2: 23 mmol/L (ref 20–29)
Calcium: 9.6 mg/dL (ref 8.7–10.3)
Chloride: 102 mmol/L (ref 96–106)
Creatinine, Ser: 0.64 mg/dL (ref 0.57–1.00)
Globulin, Total: 2.7 g/dL (ref 1.5–4.5)
Glucose: 132 mg/dL — ABNORMAL HIGH (ref 70–99)
Potassium: 4.3 mmol/L (ref 3.5–5.2)
Sodium: 140 mmol/L (ref 134–144)
Total Protein: 7.3 g/dL (ref 6.0–8.5)
eGFR: 97 mL/min/{1.73_m2} (ref >59)

## 2021-08-07 LAB — CBC
Hematocrit: 45.6 % (ref 34.0–46.6)
Hemoglobin: 15.2 g/dL (ref 11.1–15.9)
MCH: 29.9 pg (ref 26.6–33.0)
MCHC: 33.3 g/dL (ref 31.5–35.7)
MCV: 90 fL (ref 79–97)
Platelets: 258 10*3/uL (ref 150–450)
RBC: 5.09 x10E6/uL (ref 3.77–5.28)
RDW: 12.2 % (ref 11.7–15.4)
WBC: 6.8 10*3/uL (ref 3.4–10.8)

## 2021-08-07 LAB — HEMOGLOBIN A1C
Est. average glucose Bld gHb Est-mCnc: 177 mg/dL
Hgb A1c MFr Bld: 7.8 % — ABNORMAL HIGH (ref 4.8–5.6)

## 2021-08-07 LAB — LIPID PANEL
Chol/HDL Ratio: 3.3 ratio (ref 0.0–4.4)
Cholesterol, Total: 147 mg/dL (ref 100–199)
HDL: 44 mg/dL (ref >39)
LDL Chol Calc (NIH): 72 mg/dL (ref 0–99)
Triglycerides: 184 mg/dL — ABNORMAL HIGH (ref 0–149)
VLDL Cholesterol Cal: 31 mg/dL (ref 5–40)

## 2021-08-14 ENCOUNTER — Ambulatory Visit: Payer: Medicare Other | Admitting: Internal Medicine

## 2021-10-02 ENCOUNTER — Other Ambulatory Visit: Payer: Self-pay | Admitting: Internal Medicine

## 2021-10-23 ENCOUNTER — Encounter: Payer: Self-pay | Admitting: Internal Medicine

## 2021-11-10 ENCOUNTER — Encounter: Payer: Self-pay | Admitting: Internal Medicine

## 2021-11-10 ENCOUNTER — Ambulatory Visit (INDEPENDENT_AMBULATORY_CARE_PROVIDER_SITE_OTHER): Payer: Medicare Other | Admitting: Internal Medicine

## 2021-11-10 ENCOUNTER — Other Ambulatory Visit: Payer: Self-pay

## 2021-11-10 VITALS — BP 118/70 | HR 86 | Temp 98.1°F | Ht 62.2 in | Wt 187.0 lb

## 2021-11-10 DIAGNOSIS — E1169 Type 2 diabetes mellitus with other specified complication: Secondary | ICD-10-CM | POA: Diagnosis not present

## 2021-11-10 DIAGNOSIS — E785 Hyperlipidemia, unspecified: Secondary | ICD-10-CM

## 2021-11-10 DIAGNOSIS — F419 Anxiety disorder, unspecified: Secondary | ICD-10-CM | POA: Diagnosis not present

## 2021-11-10 DIAGNOSIS — E6609 Other obesity due to excess calories: Secondary | ICD-10-CM | POA: Diagnosis not present

## 2021-11-10 DIAGNOSIS — Z23 Encounter for immunization: Secondary | ICD-10-CM | POA: Diagnosis not present

## 2021-11-10 DIAGNOSIS — Z6833 Body mass index (BMI) 33.0-33.9, adult: Secondary | ICD-10-CM

## 2021-11-10 NOTE — Patient Instructions (Signed)

## 2021-11-10 NOTE — Progress Notes (Signed)
Anne Woodard,acting as a Education administrator for Anne Greenland, MD.,have documented all relevant documentation on the behalf of Anne Greenland, MD,as directed by  Anne Greenland, MD while in the presence of Anne Greenland, MD.  This visit occurred during the SARS-CoV-2 public health emergency.  Safety protocols were in place, including screening questions prior to the visit, additional usage of staff PPE, and extensive cleaning of exam room while observing appropriate contact time as indicated for disinfecting solutions.  Subjective:     Patient ID: Anne Woodard , female    DOB: 1955/11/17 , 66 y.o.   MRN: [id number]   Chief Complaint  Patient presents with   Diabetes    HPI  The patient is here today for a follow-up on her diabetes.  She reports compliance with meds. She denies headaches, chest pain and shortness of breath.   Diabetes She presents for her follow-up diabetic visit. She has type 2 diabetes mellitus. Her disease course has been stable. There are no hypoglycemic associated symptoms. Pertinent negatives for hypoglycemia include no headaches. Pertinent negatives for diabetes include no chest pain, no fatigue, no polydipsia, no polyphagia, no polyuria and no weakness. There are no hypoglycemic complications. Symptoms are worsening. Risk factors for coronary artery disease include diabetes mellitus, hypertension, obesity, sedentary lifestyle and post-menopausal. Her breakfast blood glucose range is generally 110-130 mg/dl. Eye exam is not current.    Past Medical History:  Diagnosis Date   Abnormal EKG 01/14/2018   Arthritis    Right hip   Depression    Diabetes mellitus type 2 in obese ([place]) 01/14/2018   Motion sickness    Boats   Multilevel degenerative disc disease    Pure hypercholesterolemia 01/14/2018   Vertigo      Family History  Problem Relation Age of Onset   Colon cancer Mother    Endometrial cancer Mother    Hypertension Mother    Alzheimer's disease  Father    Multiple sclerosis Son    Hypertension Son    Depression Son    Anxiety disorder Son    Breast cancer Neg Hx      Current Outpatient Medications:    Ascorbic Acid (VITAMIN C PO), Take by mouth. Take one tablet daily, Disp: , Rfl:    atorvastatin (LIPITOR) 10 MG tablet, Take 10 mg by mouth daily., Disp: , Rfl:    dapagliflozin propanediol (FARXIGA) 10 MG TABS tablet, Take 1 tablet (10 mg total) by mouth daily., Disp: 90 tablet, Rfl: 1   doxylamine, Sleep, (UNISOM) 25 MG tablet, Take 25 mg by mouth at bedtime as needed., Disp: , Rfl:    escitalopram (LEXAPRO) 10 MG tablet, TAKE 1 TABLET BY MOUTH EVERY DAY, Disp: 90 tablet, Rfl: 2   glucose blood (ONETOUCH VERIO) test strip, Use as directed to check blood sugars daily dx: e11.69, Disp: 100 each, Rfl: 2   losartan (COZAAR) 25 MG tablet, TAKE 1 TABLET BY MOUTH EVERY DAY, Disp: 90 tablet, Rfl: 2   Multiple Vitamin (MULTIVITAMIN) tablet, Take 1 tablet by mouth daily., Disp: , Rfl:    rosuvastatin (CRESTOR) 10 MG tablet, TAKE 1 TABLET BY MOUTH EVERY DAY, Disp: 90 tablet, Rfl: 1   clonazePAM (KLONOPIN) 1 MG tablet, TAKE 1 TABLET BY MOUTH EVERY DAY AS NEEDED, Disp: 30 tablet, Rfl: 0   No Known Allergies   Review of Systems  Constitutional: Negative.  Negative for fatigue.  Respiratory: Negative.    Cardiovascular: Negative.  Negative for chest pain.  Gastrointestinal: Negative.   Endocrine: Negative for polydipsia, polyphagia and polyuria.  Neurological: Negative.  Negative for weakness and headaches.  Psychiatric/Behavioral: Negative.      Today's Vitals   11/10/21 1406  BP: 118/70  Pulse: 86  Temp: 98.1 F (36.7 C)  Weight: 187 lb (84.8 kg)  Height: 5' 2.2" (1.58 m)  PainSc: 0-No pain   Body mass index is 33.98 kg/m.  Wt Readings from Last 3 Encounters:  11/10/21 187 lb (84.8 kg)  08/06/21 192 lb 9.6 oz (87.4 kg)  07/17/21 194 lb (88 kg)     Objective:  Physical Exam Vitals and nursing note reviewed.   Constitutional:      Appearance: Normal appearance.  HENT:     Head: Normocephalic and atraumatic.     Nose:     Comments: Masked     Mouth/Throat:     Comments: Masked  Eyes:     Extraocular Movements: Extraocular movements intact.  Cardiovascular:     Rate and Rhythm: Normal rate and regular rhythm.     Heart sounds: Normal heart sounds.  Pulmonary:     Effort: Pulmonary effort is normal.     Breath sounds: Normal breath sounds.  Skin:    General: Skin is warm.  Neurological:     General: No focal deficit present.     Mental Status: She is alert.  Psychiatric:        Mood and Affect: Mood normal.        Behavior: Behavior normal.      Assessment And Plan:     1. Dyslipidemia associated with type 2 diabetes mellitus ([place]) Comments: Chronic, I will check labs as below. Importance of medication/dietary compliance was discussed with the patient. Last LDL 72 in Nov 2022. F/u 4 months. - BMP8+EGFR - Hemoglobin A1c  2. Anxiety Comments: She was given refill of clonazepam. CSRS was reviewed.   3. Class 1 obesity due to excess calories with serious comorbidity and body mass index (BMI) of 33.0 to 33.9 in adult Comments: She was congratulated on her 7 lb weight loss since October 2022. She is encouraged to aim for at least 150 minutes of exercise per week.   4. Immunization due Comments: She was given Prevnar-20 IM x 1.  - Pneumococcal conjugate vaccine 20-valent (Prevnar 20)   Patient was given opportunity to ask questions. Patient verbalized understanding of the plan and was able to repeat key elements of the plan. All questions were answered to their satisfaction.   I, Anne Greenland, MD, have reviewed all documentation for this visit. The documentation on 11/10/21 for the exam, diagnosis, procedures, and orders are all accurate and complete.   IF YOU HAVE BEEN REFERRED TO A SPECIALIST, IT MAY TAKE 1-2 WEEKS TO SCHEDULE/PROCESS THE REFERRAL. IF YOU HAVE NOT HEARD FROM  US/SPECIALIST IN TWO WEEKS, PLEASE GIVE [place] A CALL AT [phone].   THE PATIENT IS ENCOURAGED TO PRACTICE SOCIAL DISTANCING DUE TO THE COVID-19 PANDEMIC.

## 2021-11-11 LAB — BMP8+EGFR
BUN/Creatinine Ratio: 15 (ref 12–28)
BUN: 10 mg/dL (ref 8–27)
CO2: 21 mmol/L (ref 20–29)
Calcium: 9.9 mg/dL (ref 8.7–10.3)
Chloride: 105 mmol/L (ref 96–106)
Creatinine, Ser: 0.65 mg/dL (ref 0.57–1.00)
Glucose: 133 mg/dL — ABNORMAL HIGH (ref 70–99)
Potassium: 4.4 mmol/L (ref 3.5–5.2)
Sodium: 141 mmol/L (ref 134–144)
eGFR: 96 mL/min/{1.73_m2} (ref >59)

## 2021-11-11 LAB — HEMOGLOBIN A1C
Est. average glucose Bld gHb Est-mCnc: 166 mg/dL
Hgb A1c MFr Bld: 7.4 % — ABNORMAL HIGH (ref 4.8–5.6)

## 2021-11-15 MED ORDER — CLONAZEPAM 1 MG PO TABS: ORAL_TABLET | ORAL | 0 refills | Status: DC

## 2022-01-02 ENCOUNTER — Other Ambulatory Visit: Payer: Self-pay | Admitting: Internal Medicine

## 2022-01-26 ENCOUNTER — Other Ambulatory Visit: Payer: Self-pay | Admitting: Internal Medicine

## 2022-02-16 ENCOUNTER — Other Ambulatory Visit: Payer: Self-pay | Admitting: Internal Medicine

## 2022-02-25 LAB — HM COLONOSCOPY

## 2022-03-12 ENCOUNTER — Ambulatory Visit (INDEPENDENT_AMBULATORY_CARE_PROVIDER_SITE_OTHER): Payer: Medicare Other | Admitting: Internal Medicine

## 2022-03-12 ENCOUNTER — Encounter: Payer: Self-pay | Admitting: Internal Medicine

## 2022-03-12 VITALS — BP 112/72 | HR 71 | Temp 98.2°F | Ht 61.8 in | Wt 179.2 lb

## 2022-03-12 DIAGNOSIS — E6609 Other obesity due to excess calories: Secondary | ICD-10-CM | POA: Diagnosis not present

## 2022-03-12 DIAGNOSIS — F419 Anxiety disorder, unspecified: Secondary | ICD-10-CM

## 2022-03-12 DIAGNOSIS — Z6832 Body mass index (BMI) 32.0-32.9, adult: Secondary | ICD-10-CM

## 2022-03-12 DIAGNOSIS — E1169 Type 2 diabetes mellitus with other specified complication: Secondary | ICD-10-CM

## 2022-03-12 DIAGNOSIS — E785 Hyperlipidemia, unspecified: Secondary | ICD-10-CM | POA: Diagnosis not present

## 2022-03-12 DIAGNOSIS — Z2821 Immunization not carried out because of patient refusal: Secondary | ICD-10-CM

## 2022-03-12 MED ORDER — CLONAZEPAM 1 MG PO TABS: 1.0000 mg | ORAL_TABLET | Freq: Every day | ORAL | 0 refills | Status: DC | PRN

## 2022-03-12 MED ORDER — DAPAGLIFLOZIN PROPANEDIOL 10 MG PO TABS: 10.0000 mg | ORAL_TABLET | Freq: Every day | ORAL | 1 refills | Status: DC

## 2022-03-12 NOTE — Patient Instructions (Signed)

## 2022-03-12 NOTE — Progress Notes (Unsigned)
[name],acting as a Education administrator for [name], MD.,have documented all relevant documentation on the behalf of [name], MD,as directed by  [name], MD while in the presence of [name], MD.  This visit occurred during the SARS-CoV-2 public health emergency.  Safety protocols were in place, including screening questions prior to the visit, additional usage of staff PPE, and extensive cleaning of exam room while observing appropriate contact time as indicated for disinfecting solutions.  Subjective:     Patient ID: Anne Woodard , female    DOB: [birth year] , 66 y.o.   MRN: [id number]   Chief Complaint  Patient presents with   Diabetes    HPI  The patient is here today for a follow-up on her diabetes.  She reports compliance with meds. She denies headaches, chest pain and shortness of breath.   Diabetes She presents for her follow-up diabetic visit. She has type 2 diabetes mellitus. Her disease course has been stable. There are no hypoglycemic associated symptoms. Pertinent negatives for hypoglycemia include no headaches. Pertinent negatives for diabetes include no chest pain, no fatigue, no polydipsia, no polyphagia, no polyuria and no weakness. There are no hypoglycemic complications. Symptoms are worsening. Risk factors for coronary artery disease include diabetes mellitus, hypertension, obesity, sedentary lifestyle and post-menopausal. Her breakfast blood glucose range is generally 110-130 mg/dl. Eye exam is not current.     Past Medical History:  Diagnosis Date   Abnormal EKG 01/14/2018   Arthritis    Right hip   Depression    Diabetes mellitus type 2 in obese ([place]) 01/14/2018   Motion sickness    Boats   Multilevel degenerative disc disease    Pure hypercholesterolemia 01/14/2018   Vertigo      Family History  Problem Relation Age of Onset   Colon cancer Mother    Endometrial cancer Mother    Hypertension Mother    Alzheimer's disease  Father    Multiple sclerosis Son    Hypertension Son    Depression Son    Anxiety disorder Son    Breast cancer Neg Hx      Current Outpatient Medications:    doxylamine, Sleep, (UNISOM) 25 MG tablet, Take 25 mg by mouth at bedtime as needed., Disp: , Rfl:    escitalopram (LEXAPRO) 10 MG tablet, TAKE 1 TABLET BY MOUTH EVERY DAY, Disp: 90 tablet, Rfl: 2   glucose blood (ONETOUCH VERIO) test strip, Use as directed to check blood sugars daily dx: e11.69, Disp: 100 each, Rfl: 2   losartan (COZAAR) 25 MG tablet, TAKE 1 TABLET BY MOUTH EVERY DAY, Disp: 90 tablet, Rfl: 2   Multiple Vitamin (MULTIVITAMIN) tablet, Take 1 tablet by mouth daily., Disp: , Rfl:    rosuvastatin (CRESTOR) 10 MG tablet, TAKE 1 TABLET BY MOUTH EVERY DAY, Disp: 90 tablet, Rfl: 1   Ascorbic Acid (VITAMIN C PO), Take by mouth. Take one tablet daily (Patient not taking: Reported on 03/12/2022), Disp: , Rfl:    clonazePAM (KLONOPIN) 1 MG tablet, Take 1 tablet (1 mg total) by mouth daily as needed., Disp: 30 tablet, Rfl: 0   dapagliflozin propanediol (FARXIGA) 10 MG TABS tablet, Take 1 tablet (10 mg total) by mouth daily., Disp: 90 tablet, Rfl: 1   No Known Allergies   Review of Systems  Constitutional: Negative.  Negative for fatigue.  Respiratory: Negative.    Cardiovascular: Negative.  Negative for chest pain.  Gastrointestinal: Negative.   Endocrine: Negative for  polydipsia, polyphagia and polyuria.  Neurological: Negative.  Negative for weakness and headaches.  Psychiatric/Behavioral: Negative.       Today's Vitals   03/12/22 1050  BP: 112/72  Pulse: 71  Temp: 98.2 F (36.8 C)  Weight: 179 lb 3.2 oz (81.3 kg)  Height: 5' 1.8" (1.57 m)  PainSc: 0-No pain   Body mass index is 32.99 kg/m.  Wt Readings from Last 3 Encounters:  03/12/22 179 lb 3.2 oz (81.3 kg)  11/10/21 187 lb (84.8 kg)  08/06/21 192 lb 9.6 oz (87.4 kg)    Objective:  Physical Exam Vitals and nursing note reviewed.  Constitutional:       Appearance: Normal appearance.  HENT:     Head: Normocephalic and atraumatic.  Cardiovascular:     Rate and Rhythm: Normal rate and regular rhythm.     Heart sounds: Normal heart sounds.  Pulmonary:     Effort: Pulmonary effort is normal.     Breath sounds: Normal breath sounds.  Musculoskeletal:     Cervical back: Normal range of motion.  Skin:    General: Skin is warm.  Neurological:     General: No focal deficit present.     Mental Status: She is alert.  Psychiatric:        Mood and Affect: Mood normal.        Behavior: Behavior normal.       Assessment And Plan:     1. Dyslipidemia associated with type 2 diabetes mellitus ([place]) Comments: Chronic, I will check labs as below. I will adjust meds as needed. She is encouraged to schedule eye exam in the near future. She will f/u in 4 months.  - Hemoglobin A1c - Lipid panel - Urine microalbumin-creatinine with uACR - CMP14+EGFR  2. Anxiety Comments: Chronic, I will refill clonazepam prn.   3. Class 1 obesity due to excess calories with serious comorbidity and body mass index (BMI) of 32.0 to 32.9 in adult Comments: S'he was congratulated on her 8 lb weight loss since FEb 2023. She is encouraged to aim for at least 150 minutes of exercise per week.    Patient was given opportunity to ask questions. Patient verbalized understanding of the plan and was able to repeat key elements of the plan. All questions were answered to their satisfaction.   I, [name], MD, have reviewed all documentation for this visit. The documentation on 03/12/22 for the exam, diagnosis, procedures, and orders are all accurate and complete.   IF YOU HAVE BEEN REFERRED TO A SPECIALIST, IT MAY TAKE 1-2 WEEKS TO SCHEDULE/PROCESS THE REFERRAL. IF YOU HAVE NOT HEARD FROM US/SPECIALIST IN TWO WEEKS, PLEASE GIVE [place] A CALL AT [phone].   THE PATIENT IS ENCOURAGED TO PRACTICE SOCIAL DISTANCING DUE TO THE COVID-19 PANDEMIC.

## 2022-03-13 LAB — CMP14+EGFR
ALT: 23 IU/L (ref 0–32)
AST: 24 IU/L (ref 0–40)
Albumin/Globulin Ratio: 1.6 (ref 1.2–2.2)
Albumin: 4.6 g/dL (ref 3.8–4.8)
Alkaline Phosphatase: 95 IU/L (ref 44–121)
BUN/Creatinine Ratio: 21 (ref 12–28)
BUN: 15 mg/dL (ref 8–27)
Bilirubin Total: 0.4 mg/dL (ref 0.0–1.2)
CO2: 19 mmol/L — ABNORMAL LOW (ref 20–29)
Calcium: 9.7 mg/dL (ref 8.7–10.3)
Chloride: 103 mmol/L (ref 96–106)
Creatinine, Ser: 0.71 mg/dL (ref 0.57–1.00)
Globulin, Total: 2.9 g/dL (ref 1.5–4.5)
Glucose: 130 mg/dL — ABNORMAL HIGH (ref 70–99)
Potassium: 4.6 mmol/L (ref 3.5–5.2)
Sodium: 140 mmol/L (ref 134–144)
Total Protein: 7.5 g/dL (ref 6.0–8.5)
eGFR: 93 mL/min/{1.73_m2} (ref >59)

## 2022-03-13 LAB — LIPID PANEL
Chol/HDL Ratio: 2.7 ratio (ref 0.0–4.4)
Cholesterol, Total: 129 mg/dL (ref 100–199)
HDL: 48 mg/dL (ref >39)
LDL Chol Calc (NIH): 62 mg/dL (ref 0–99)
Triglycerides: 106 mg/dL (ref 0–149)
VLDL Cholesterol Cal: 19 mg/dL (ref 5–40)

## 2022-03-13 LAB — MICROALBUMIN / CREATININE URINE RATIO
Creatinine, Urine: 112.4 mg/dL
Microalb/Creat Ratio: 5 mg/g creat (ref 0–29)
Microalbumin, Urine: 5.9 ug/mL

## 2022-03-13 LAB — HEMOGLOBIN A1C
Est. average glucose Bld gHb Est-mCnc: 126 mg/dL
Hgb A1c MFr Bld: 6 % — ABNORMAL HIGH (ref 4.8–5.6)

## 2022-03-16 DIAGNOSIS — E66811 Obesity, class 1: Secondary | ICD-10-CM | POA: Insufficient documentation

## 2022-03-16 DIAGNOSIS — E6609 Other obesity due to excess calories: Secondary | ICD-10-CM | POA: Insufficient documentation

## 2022-03-19 ENCOUNTER — Ambulatory Visit: Payer: Medicare Other | Admitting: Internal Medicine

## 2022-05-19 ENCOUNTER — Other Ambulatory Visit: Payer: Self-pay | Admitting: Internal Medicine

## 2022-05-21 ENCOUNTER — Other Ambulatory Visit: Payer: Self-pay | Admitting: Internal Medicine

## 2022-05-21 ENCOUNTER — Other Ambulatory Visit: Payer: Self-pay

## 2022-05-21 MED ORDER — ROSUVASTATIN CALCIUM 10 MG PO TABS: 10.0000 mg | ORAL_TABLET | Freq: Every day | ORAL | 1 refills | Status: DC

## 2022-06-09 IMAGING — MG DIGITAL SCREENING BILAT W/ TOMO W/ CAD
8 series · 8 of 24 positions shown · non-contrast
Comparison: Previous exam(s).

CLINICAL DATA: Screening.

EXAM:
DIGITAL SCREENING BILATERAL MAMMOGRAM WITH TOMO AND CAD

[L MLO synth-2D]
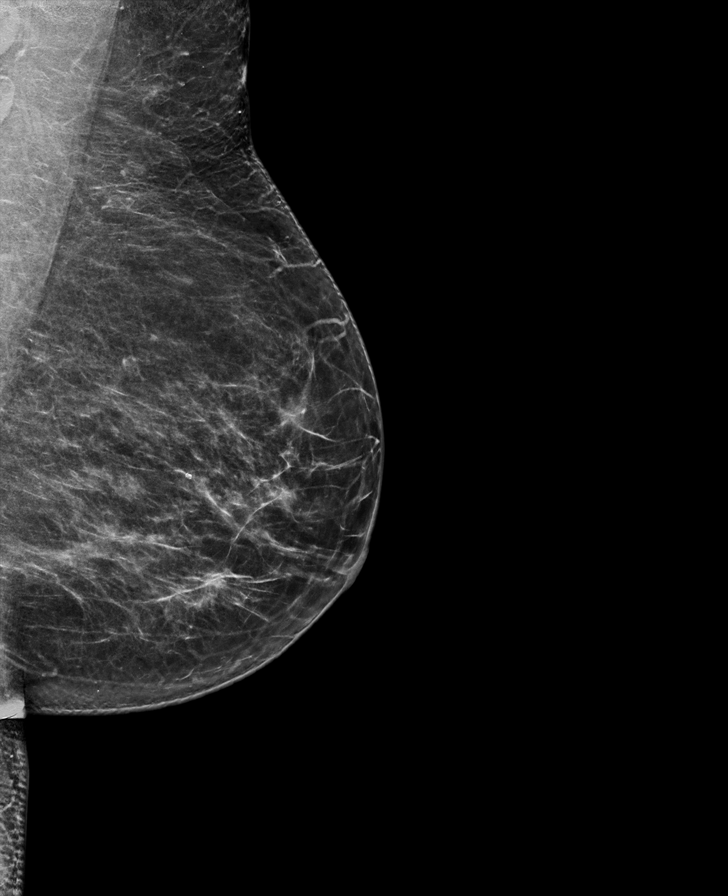

[R MLO synth-2D]
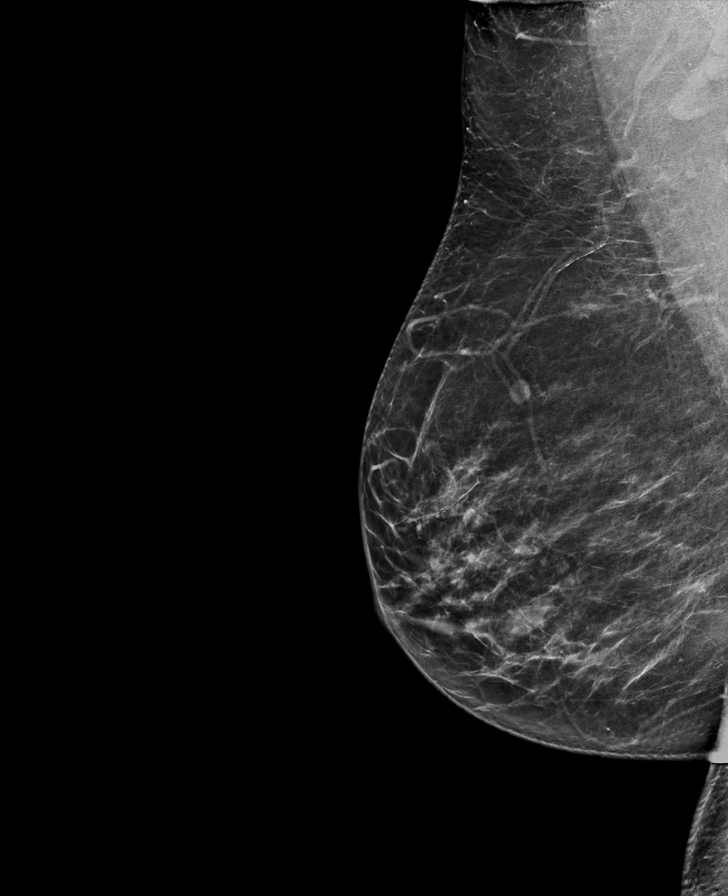

[L CC synth-2D]
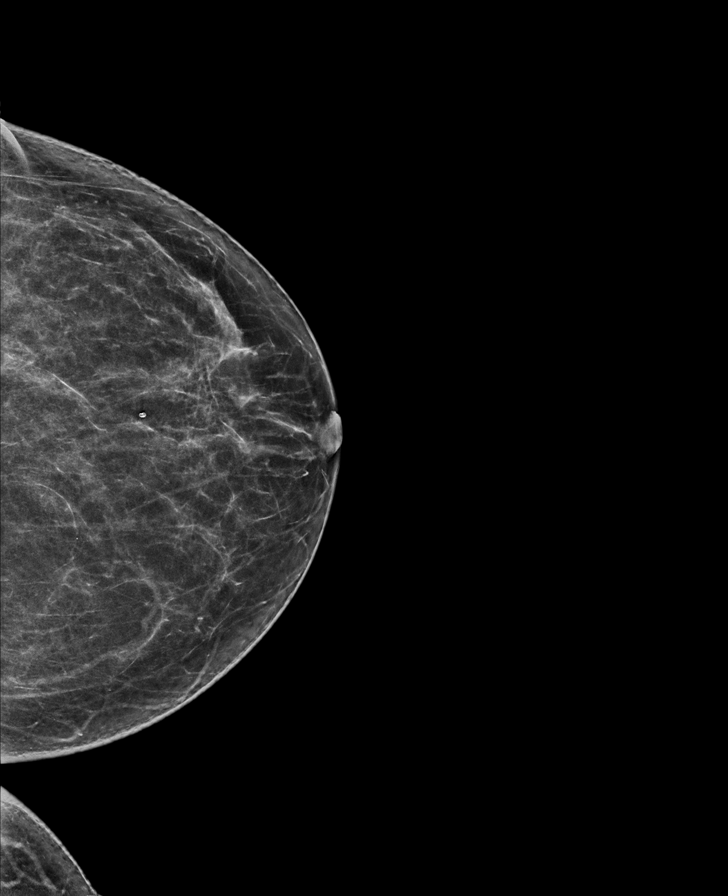

[R CC synth-2D]
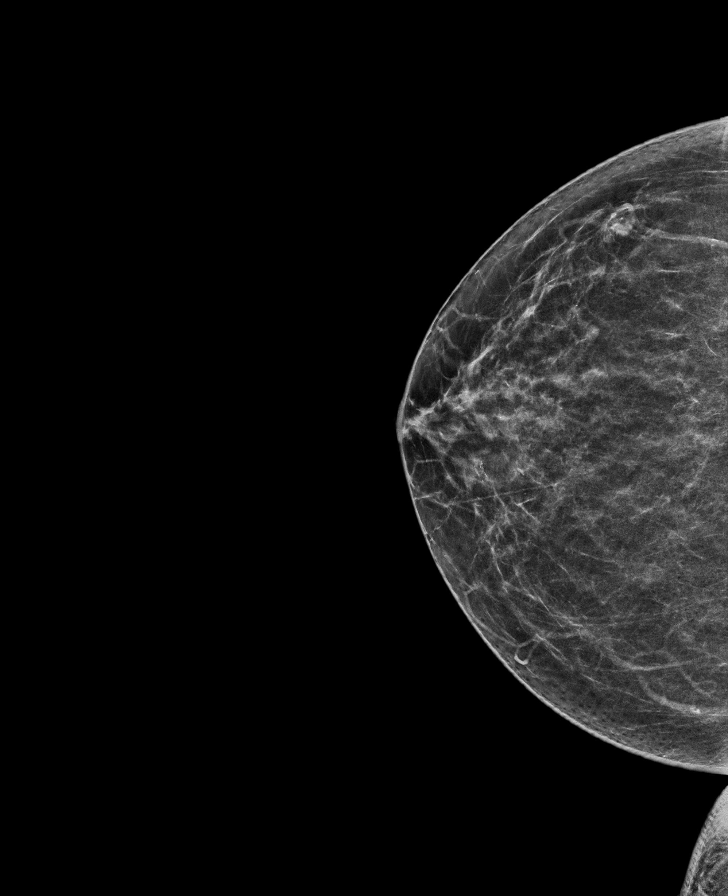

[R MLO tomo · tomo slice 35/68.0]
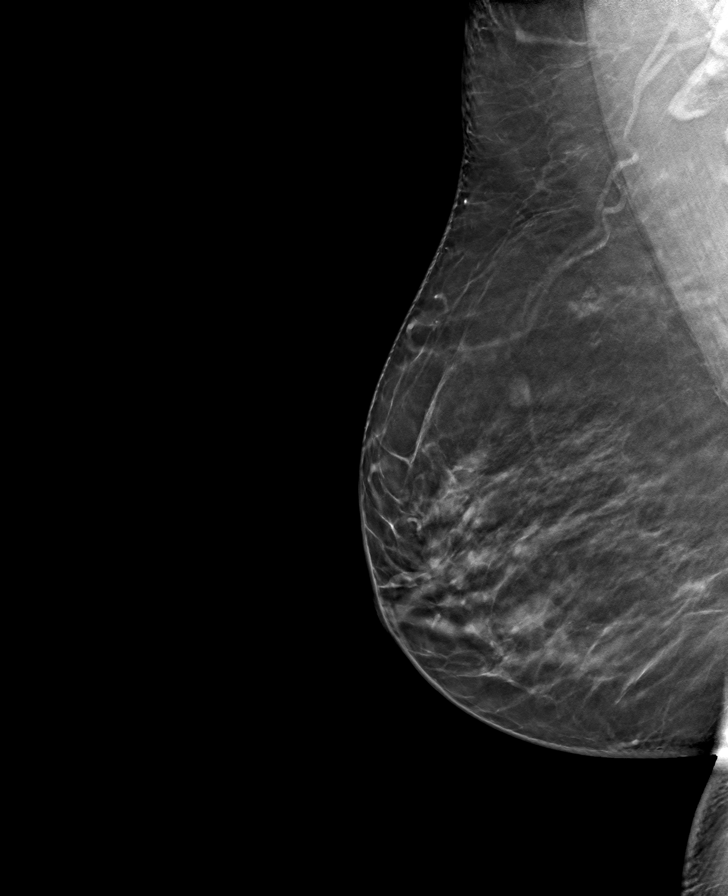

[L CC tomo · tomo slice 34/67.0]
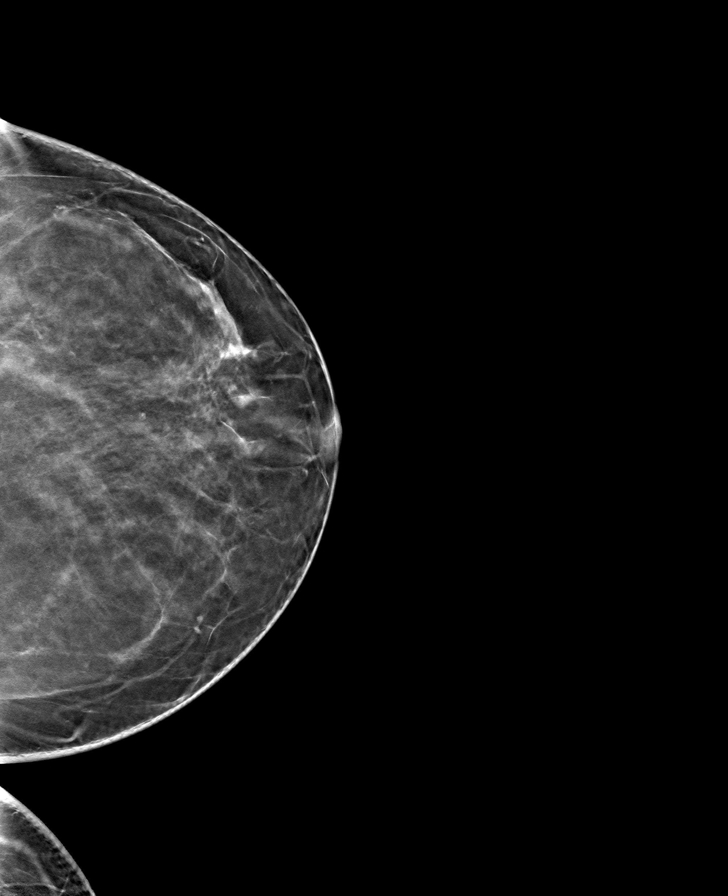

[R CC tomo · tomo slice 31/60.0]
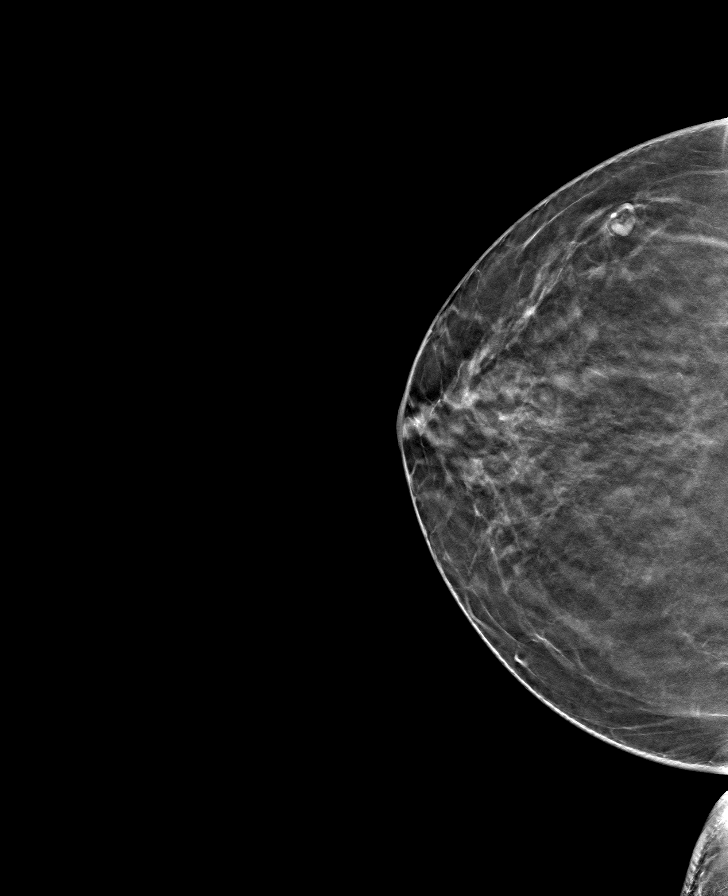

[L MLO tomo · tomo slice 35/70.0]
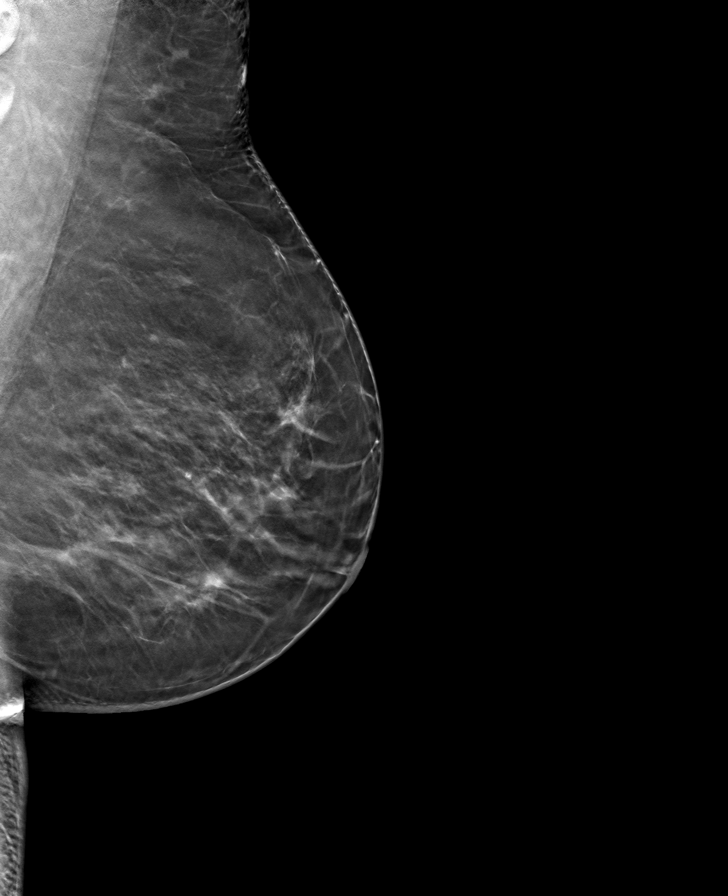

[8 of 24 positions shown; findings below may reference images not displayed]

ACR Breast Density Category b: There are scattered areas of
fibroglandular density.
FINDINGS: There are no findings suspicious for malignancy. Images were
processed with CAD.
IMPRESSION: No mammographic evidence of malignancy. A result letter of this
screening mammogram will be mailed directly to the patient.

RECOMMENDATION:
Screening mammogram in one year. (Code:[id number])

BI-RADS CATEGORY  1: Negative.

## 2022-06-12 DIAGNOSIS — Z961 Presence of intraocular lens: Secondary | ICD-10-CM | POA: Diagnosis not present

## 2022-06-12 LAB — HM DIABETES EYE EXAM

## 2022-06-18 ENCOUNTER — Encounter: Payer: Self-pay | Admitting: Internal Medicine

## 2022-07-11 ENCOUNTER — Other Ambulatory Visit: Payer: Self-pay | Admitting: Internal Medicine

## 2022-07-13 ENCOUNTER — Encounter: Payer: Self-pay | Admitting: Internal Medicine

## 2022-07-14 ENCOUNTER — Encounter: Payer: Self-pay | Admitting: Internal Medicine

## 2022-08-03 ENCOUNTER — Other Ambulatory Visit: Payer: Self-pay

## 2022-08-03 MED ORDER — ROSUVASTATIN CALCIUM 10 MG PO TABS: 10.0000 mg | ORAL_TABLET | Freq: Every day | ORAL | 1 refills | Status: DC

## 2022-08-05 ENCOUNTER — Encounter: Payer: Self-pay | Admitting: Internal Medicine

## 2022-08-05 ENCOUNTER — Ambulatory Visit (INDEPENDENT_AMBULATORY_CARE_PROVIDER_SITE_OTHER): Payer: Medicare Other

## 2022-08-05 ENCOUNTER — Ambulatory Visit (INDEPENDENT_AMBULATORY_CARE_PROVIDER_SITE_OTHER): Payer: Medicare Other | Admitting: Internal Medicine

## 2022-08-05 VITALS — BP 120/70 | HR 84 | Temp 98.6°F | Ht 61.0 in | Wt 180.0 lb

## 2022-08-05 VITALS — BP 120/70 | HR 84 | Temp 98.6°F | Ht 61.0 in | Wt 180.6 lb

## 2022-08-05 DIAGNOSIS — M25551 Pain in right hip: Secondary | ICD-10-CM | POA: Diagnosis not present

## 2022-08-05 DIAGNOSIS — E785 Hyperlipidemia, unspecified: Secondary | ICD-10-CM

## 2022-08-05 DIAGNOSIS — E1169 Type 2 diabetes mellitus with other specified complication: Secondary | ICD-10-CM | POA: Diagnosis not present

## 2022-08-05 DIAGNOSIS — Z0000 Encounter for general adult medical examination without abnormal findings: Secondary | ICD-10-CM

## 2022-08-05 DIAGNOSIS — W101XXA Fall (on)(from) sidewalk curb, initial encounter: Secondary | ICD-10-CM

## 2022-08-05 DIAGNOSIS — E6609 Other obesity due to excess calories: Secondary | ICD-10-CM

## 2022-08-05 DIAGNOSIS — M25512 Pain in left shoulder: Secondary | ICD-10-CM | POA: Diagnosis not present

## 2022-08-05 DIAGNOSIS — Z23 Encounter for immunization: Secondary | ICD-10-CM

## 2022-08-05 DIAGNOSIS — Z6834 Body mass index (BMI) 34.0-34.9, adult: Secondary | ICD-10-CM

## 2022-08-05 NOTE — Patient Instructions (Signed)
[name] , Thank you for taking time to come for your Medicare Wellness Visit. I appreciate your ongoing commitment to your health goals. Please review the following plan we discussed and let me know if I can assist you in the future.   Screening recommendations/referrals: Colonoscopy: completed 02/25/2022, due 02/26/2027 Mammogram: scheduled for 08/07/2022 Bone Density: completed 05/13/2020 Recommended yearly ophthalmology/optometry visit for glaucoma screening and checkup Recommended yearly dental visit for hygiene and checkup  Vaccinations: Influenza vaccine: today Pneumococcal vaccine: completed 11/10/2021 Tdap vaccine: completed 04/29/2017, due 04/30/2027 Shingles vaccine: discussed   Covid-19: 11/01/2020, 01/24/2020, 12/29/2019  Advanced directives: Advance directive discussed with you today. Even though you declined this today please call our office should you change your mind and we can give you the proper paperwork for you to fill out.  Conditions/risks identified: none  Next appointment: Follow up in one year for your annual wellness visit    Preventive Care 65 Years and Older, Female Preventive care refers to lifestyle choices and visits with your health care provider that can promote health and wellness. What does preventive care include? A yearly physical exam. This is also called an annual well check. Dental exams once or twice a year. Routine eye exams. Ask your health care provider how often you should have your eyes checked. Personal lifestyle choices, including: Daily care of your teeth and gums. Regular physical activity. Eating a healthy diet. Avoiding tobacco and drug use. Limiting alcohol use. Practicing safe sex. Taking low-dose aspirin every day. Taking vitamin and mineral supplements as recommended by your health care provider. What happens during an annual well check? The services and screenings done by your health care provider during your annual well check  will depend on your age, overall health, lifestyle risk factors, and family history of disease. Counseling  Your health care provider may ask you questions about your: Alcohol use. Tobacco use. Drug use. Emotional well-being. Home and relationship well-being. Sexual activity. Eating habits. History of falls. Memory and ability to understand (cognition). Work and work Statistician. Reproductive health. Screening  You may have the following tests or measurements: Height, weight, and BMI. Blood pressure. Lipid and cholesterol levels. These may be checked every 5 years, or more frequently if you are over 58 years old. Skin check. Lung cancer screening. You may have this screening every year starting at age 77 if you have a 30-pack-year history of smoking and currently smoke or have quit within the past 15 years. Fecal occult blood test (FOBT) of the stool. You may have this test every year starting at age 31. Flexible sigmoidoscopy or colonoscopy. You may have a sigmoidoscopy every 5 years or a colonoscopy every 10 years starting at age 54. Hepatitis C blood test. Hepatitis B blood test. Sexually transmitted disease (STD) testing. Diabetes screening. This is done by checking your blood sugar (glucose) after you have not eaten for a while (fasting). You may have this done every 1-3 years. Bone density scan. This is done to screen for osteoporosis. You may have this done starting at age 10. Mammogram. This may be done every 1-2 years. Talk to your health care provider about how often you should have regular mammograms. Talk with your health care provider about your test results, treatment options, and if necessary, the need for more tests. Vaccines  Your health care provider may recommend certain vaccines, such as: Influenza vaccine. This is recommended every year. Tetanus, diphtheria, and acellular pertussis (Tdap, Td) vaccine. You may need a Td booster every  10 years. Zoster vaccine. You  may need this after age 68. Pneumococcal 13-valent conjugate (PCV13) vaccine. One dose is recommended after age 10. Pneumococcal polysaccharide (PPSV23) vaccine. One dose is recommended after age 59. Talk to your health care provider about which screenings and vaccines you need and how often you need them. This information is not intended to replace advice given to you by your health care provider. Make sure you discuss any questions you have with your health care provider. Document Released: 10/18/2015 Document Revised: 06/10/2016 Document Reviewed: 07/23/2015 Elsevier Interactive Patient Education  2017 [place] Prevention in the Home Falls can cause injuries. They can happen to people of all ages. There are many things you can do to make your home safe and to help prevent falls. What can I do on the outside of my home? Regularly fix the edges of walkways and driveways and fix any cracks. Remove anything that might make you trip as you walk through a door, such as a raised step or threshold. Trim any bushes or trees on the path to your home. Use bright outdoor lighting. Clear any walking paths of anything that might make someone trip, such as rocks or tools. Regularly check to see if handrails are loose or broken. Make sure that both sides of any steps have handrails. Any raised decks and porches should have guardrails on the edges. Have any leaves, snow, or ice cleared regularly. Use sand or salt on walking paths during winter. Clean up any spills in your garage right away. This includes oil or grease spills. What can I do in the bathroom? Use night lights. Install grab bars by the toilet and in the tub and shower. Do not use towel bars as grab bars. Use non-skid mats or decals in the tub or shower. If you need to sit down in the shower, use a plastic, non-slip stool. Keep the floor dry. Clean up any water that spills on the floor as soon as it happens. Remove soap buildup  in the tub or shower regularly. Attach bath mats securely with double-sided non-slip rug tape. Do not have throw rugs and other things on the floor that can make you trip. What can I do in the bedroom? Use night lights. Make sure that you have a light by your bed that is easy to reach. Do not use any sheets or blankets that are too big for your bed. They should not hang down onto the floor. Have a firm chair that has side arms. You can use this for support while you get dressed. Do not have throw rugs and other things on the floor that can make you trip. What can I do in the kitchen? Clean up any spills right away. Avoid walking on wet floors. Keep items that you use a lot in easy-to-reach places. If you need to reach something above you, use a strong step stool that has a grab bar. Keep electrical cords out of the way. Do not use floor polish or wax that makes floors slippery. If you must use wax, use non-skid floor wax. Do not have throw rugs and other things on the floor that can make you trip. What can I do with my stairs? Do not leave any items on the stairs. Make sure that there are handrails on both sides of the stairs and use them. Fix handrails that are broken or loose. Make sure that handrails are as long as the stairways. Check any carpeting to make  sure that it is firmly attached to the stairs. Fix any carpet that is loose or worn. Avoid having throw rugs at the top or bottom of the stairs. If you do have throw rugs, attach them to the floor with carpet tape. Make sure that you have a light switch at the top of the stairs and the bottom of the stairs. If you do not have them, ask someone to add them for you. What else can I do to help prevent falls? Wear shoes that: Do not have high heels. Have rubber bottoms. Are comfortable and fit you well. Are closed at the toe. Do not wear sandals. If you use a stepladder: Make sure that it is fully opened. Do not climb a closed  stepladder. Make sure that both sides of the stepladder are locked into place. Ask someone to hold it for you, if possible. Clearly mark and make sure that you can see: Any grab bars or handrails. First and last steps. Where the edge of each step is. Use tools that help you move around (mobility aids) if they are needed. These include: Canes. Walkers. Scooters. Crutches. Turn on the lights when you go into a dark area. Replace any light bulbs as soon as they burn out. Set up your furniture so you have a clear path. Avoid moving your furniture around. If any of your floors are uneven, fix them. If there are any pets around you, be aware of where they are. Review your medicines with your doctor. Some medicines can make you feel dizzy. This can increase your chance of falling. Ask your doctor what other things that you can do to help prevent falls. This information is not intended to replace advice given to you by your health care provider. Make sure you discuss any questions you have with your health care provider. Document Released: 07/18/2009 Document Revised: 02/27/2016 Document Reviewed: 10/26/2014 Elsevier Interactive Patient Education  2017 [employer].

## 2022-08-05 NOTE — Progress Notes (Signed)
Anne Woodard,acting as a Education administrator for Anne Greenland, MD.,have documented all relevant documentation on the behalf of Anne Greenland, MD,as directed by  Anne Greenland, MD while in the presence of Anne Greenland, MD.    Subjective:     Patient ID: Anne Woodard , female    DOB: [birth year] , 66 y.o.   MRN: [id number]   Chief Complaint  Patient presents with   Diabetes   Hypertension    HPI  The patient is here today for a follow-up on her diabetes.  She reports compliance with meds. She denies headaches, chest pain and shortness of breath. Patient states she needs some samples of Farxiga. Patient had a fall a few weeks ago and states [place],she reports not being hurt but her right shoulder was sore for a while.  BP Readings from Last 3 Encounters: 08/05/22 : 120/70 03/12/22 : 112/72 11/10/21 : 118/70    Diabetes She presents for her follow-up diabetic visit. She has type 2 diabetes mellitus. Her disease course has been stable. There are no hypoglycemic associated symptoms. Pertinent negatives for hypoglycemia include no headaches. Pertinent negatives for diabetes include no chest pain, no fatigue, no polydipsia, no polyphagia, no polyuria and no weakness. There are no hypoglycemic complications. Symptoms are worsening. Risk factors for coronary artery disease include diabetes mellitus, hypertension, obesity, sedentary lifestyle and post-menopausal. Her breakfast blood glucose range is generally 110-130 mg/dl. Eye exam is not current.     Past Medical History:  Diagnosis Date   Abnormal EKG 01/14/2018   Arthritis    Right hip   Depression    Diabetes mellitus type 2 in obese ([place]) 01/14/2018   Motion sickness    Boats   Multilevel degenerative disc disease    Pure hypercholesterolemia 01/14/2018   Vertigo      Family History  Problem Relation Age of Onset   Colon cancer Mother    Endometrial cancer Mother    Hypertension Mother     Alzheimer's disease Father    Multiple sclerosis Son    Hypertension Son    Depression Son    Anxiety disorder Son    Breast cancer Neg Hx      Current Outpatient Medications:    dapagliflozin propanediol (FARXIGA) 10 MG TABS tablet, Take 1 tablet (10 mg total) by mouth daily., Disp: 90 tablet, Rfl: 1   doxylamine, Sleep, (UNISOM) 25 MG tablet, Take 25 mg by mouth at bedtime as needed., Disp: , Rfl:    escitalopram (LEXAPRO) 10 MG tablet, TAKE 1 TABLET BY MOUTH EVERY DAY, Disp: 90 tablet, Rfl: 2   losartan (COZAAR) 25 MG tablet, TAKE 1 TABLET BY MOUTH EVERY DAY, Disp: 90 tablet, Rfl: 2   Multiple Vitamin (MULTIVITAMIN) tablet, Take 1 tablet by mouth daily., Disp: , Rfl:    rosuvastatin (CRESTOR) 10 MG tablet, TAKE 1 TABLET BY MOUTH EVERY DAY, Disp: 90 tablet, Rfl: 1   rosuvastatin (CRESTOR) 10 MG tablet, Take 1 tablet (10 mg total) by mouth daily., Disp: 90 tablet, Rfl: 1   Ascorbic Acid (VITAMIN C PO), Take by mouth. Take one tablet daily (Patient not taking: Reported on 03/12/2022), Disp: , Rfl:    clonazePAM (KLONOPIN) 1 MG tablet, TAKE 1 TABLET BY MOUTH EVERY DAY AS NEEDED (Patient not taking: Reported on 08/05/2022), Disp: 30 tablet, Rfl: 0   glucose blood (ONETOUCH VERIO) test strip, Use as directed to check blood sugars daily dx: e11.69 (Patient  not taking: Reported on 08/05/2022), Disp: 100 each, Rfl: 2   No Known Allergies   Review of Systems  Constitutional: Negative.  Negative for fatigue.  Respiratory: Negative.    Cardiovascular: Negative.  Negative for chest pain.  Gastrointestinal: Negative.   Endocrine: Negative for polydipsia, polyphagia and polyuria.  Musculoskeletal:  Positive for arthralgias.       She has left shoulder pain. There is pain with movement. Had fall on 07/24/22.   She also has r hip pain. Seen at [facility] in the past, advised she had "bone on bone", but surgery was not needed at the time.   Neurological: Negative.  Negative for weakness and headaches.   Psychiatric/Behavioral: Negative.       Today's Vitals   08/05/22 0903  BP: 120/70  Pulse: 84  Temp: 98.6 F (37 C)  TempSrc: Oral  Weight: 180 lb 9.6 oz (81.9 kg)  Height: _0  (1.549 m)  PainSc: 2   PainLoc: Shoulder   Body mass index is 34.12 kg/m.  Wt Readings from Last 3 Encounters:  08/05/22 180 lb (81.6 kg)  08/05/22 180 lb 9.6 oz (81.9 kg)  03/12/22 179 lb 3.2 oz (81.3 kg)    Objective:  Physical Exam Vitals and nursing note reviewed.  Constitutional:      Appearance: Normal appearance.  HENT:     Head: Normocephalic and atraumatic.     Nose:     Comments: Masked     Mouth/Throat:     Comments: Masked  Eyes:     Extraocular Movements: Extraocular movements intact.  Cardiovascular:     Rate and Rhythm: Normal rate and regular rhythm.     Heart sounds: Normal heart sounds.  Pulmonary:     Effort: Pulmonary effort is normal.     Breath sounds: Normal breath sounds.  Musculoskeletal:     Cervical back: Normal range of motion.  Skin:    General: Skin is warm.  Neurological:     General: No focal deficit present.     Mental Status: She is alert.  Psychiatric:        Mood and Affect: Mood normal.        Behavior: Behavior normal.       Assessment And Plan:     1. Dyslipidemia associated with type 2 diabetes mellitus ([place]) Comments: Chronic, I will check labs today. she will rto in 3-4 months for re-evaluation. LDL goal <70.  She will rto in 4 months for CPE.  - Hemoglobin A1c - CMP14+EGFR  2. Fall involving sidewalk curb, initial encounter Comments: Occurred on 07/24/22. She is encouraged to pay close attention to curbs to decrease risk of future falls.   3. Acute pain of left shoulder Comments: Fall occurred on 07/24/22. She does not wish to seek Ortho eval at this time. She should perform ROM exercises daily. She will let me know if sx worsen.   4. Right hip pain Comments: She plans to see Anne Woodard in [place], previously evaluated by  Anne Woodard. Her sx are likely due to OA.   5. Class 1 obesity due to excess calories with serious comorbidity and body mass index (BMI) of 34.0 to 34.9 in adult Comments: She is encouraged to aim for at least 150 minutes of exercise per week.   6. Need for influenza vaccination - Flu Vaccine QUAD High Dose(Fluad)   Patient was given opportunity to ask questions. Patient verbalized understanding of the plan and was able to repeat key elements of  the plan. All questions were answered to their satisfaction.   I, Anne Greenland, MD, have reviewed all documentation for this visit. The documentation on 08/05/22 for the exam, diagnosis, procedures, and orders are all accurate and complete.   IF YOU HAVE BEEN REFERRED TO A SPECIALIST, IT MAY TAKE 1-2 WEEKS TO SCHEDULE/PROCESS THE REFERRAL. IF YOU HAVE NOT HEARD FROM US/SPECIALIST IN TWO WEEKS, PLEASE GIVE [place] A CALL AT [phone].   THE PATIENT IS ENCOURAGED TO PRACTICE SOCIAL DISTANCING DUE TO THE COVID-19 PANDEMIC.

## 2022-08-05 NOTE — Patient Instructions (Signed)

## 2022-08-05 NOTE — Progress Notes (Signed)
Subjective:   Anne Woodard is a 66 y.o. female who presents for Medicare Annual (Subsequent) preventive examination.  Review of Systems     Cardiac Risk Factors include: advanced age (>67men, >72 women);diabetes mellitus;dyslipidemia;obesity (BMI >30kg/m2)     Objective:    Today's Vitals   08/05/22 0917 08/05/22 0934  BP: 120/70   Pulse: 84   Temp: 98.6 F (37 C)   TempSrc: Oral   Weight: 180 lb (81.6 kg)   Height: 5\' 1"  (1.549 m)   PainSc:  2    Body mass index is 34.01 kg/m.     08/05/2022    9:38 AM 07/17/2021    2:36 PM 05/05/2021    6:30 AM 04/21/2021    9:55 AM 07/11/2020   11:10 AM 06/27/2019    3:41 PM 06/29/2018   11:37 AM  Advanced Directives  Does Patient Have a Medical Advance Directive? No No No No No No No  Would patient like information on creating a medical advance directive? No - Patient declined  No - Patient declined Yes (MAU/Ambulatory/Procedural Areas - Information given) Yes (MAU/Ambulatory/Procedural Areas - Information given)  No - Patient declined    Current Medications (verified) Outpatient Encounter Medications as of 08/05/2022  Medication Sig   Ascorbic Acid (VITAMIN C PO) Take by mouth. Take one tablet daily (Patient not taking: Reported on 03/12/2022)   clonazePAM (KLONOPIN) 1 MG tablet TAKE 1 TABLET BY MOUTH EVERY DAY AS NEEDED (Patient not taking: Reported on 08/05/2022)   dapagliflozin propanediol (FARXIGA) 10 MG TABS tablet Take 1 tablet (10 mg total) by mouth daily.   doxylamine, Sleep, (UNISOM) 25 MG tablet Take 25 mg by mouth at bedtime as needed.   escitalopram (LEXAPRO) 10 MG tablet TAKE 1 TABLET BY MOUTH EVERY DAY   glucose blood (ONETOUCH VERIO) test strip Use as directed to check blood sugars daily dx: e11.69 (Patient not taking: Reported on 08/05/2022)   losartan (COZAAR) 25 MG tablet TAKE 1 TABLET BY MOUTH EVERY DAY   Multiple Vitamin (MULTIVITAMIN) tablet Take 1 tablet by mouth daily.   rosuvastatin (CRESTOR) 10 MG tablet TAKE 1  TABLET BY MOUTH EVERY DAY   rosuvastatin (CRESTOR) 10 MG tablet Take 1 tablet (10 mg total) by mouth daily.   No facility-administered encounter medications on file as of 08/05/2022.    Allergies (verified) Patient has no known allergies.   History: Past Medical History:  Diagnosis Date   Abnormal EKG 01/14/2018   Arthritis    Right hip   Depression    Diabetes mellitus type 2 in obese (HCC) 01/14/2018   Motion sickness    Boats   Multilevel degenerative disc disease    Pure hypercholesterolemia 01/14/2018   Vertigo    Past Surgical History:  Procedure Laterality Date   CATARACT EXTRACTION W/PHACO Right 04/21/2021   Procedure: CATARACT EXTRACTION PHACO AND INTRAOCULAR LENS PLACEMENT (IOC) RIGHT DIABETIC;  Surgeon: 04/23/2021, MD;  Location: [facility];  Service: Ophthalmology;  Laterality: Right;  1.85 00:18.0   CATARACT EXTRACTION W/PHACO Left 05/05/2021   Procedure: CATARACT EXTRACTION PHACO AND INTRAOCULAR LENS PLACEMENT (IOC) LEFT DIABETIC 2.31 00:21.0;  Surgeon: 07/05/2021, MD;  Location: [facility];  Service: Ophthalmology;  Laterality: Left;   PARTIAL HYSTERECTOMY  2000   cervical cancer   Family History  Problem Relation Age of Onset   Colon cancer Mother    Endometrial cancer Mother    Hypertension Mother    Alzheimer's disease Father  Multiple sclerosis Son    Hypertension Son    Depression Son    Anxiety disorder Son    Breast cancer Neg Hx    Social History   Socioeconomic History   Marital status: Married    Spouse name: Not on file   Number of children: Not on file   Years of education: Not on file   Highest education level: Not on file  Occupational History   Not on file  Tobacco Use   Smoking status: Never   Smokeless tobacco: Never   Tobacco comments:    Not applicable.   Vaping Use   Vaping Use: Never used  Substance and Sexual Activity   Alcohol use: No   Drug use: Not Currently   Sexual activity: Not  on file  Other Topics Concern   Not on file  Social History Narrative   Not on file   Social Determinants of Health   Financial Resource Strain: Low Risk  (08/05/2022)   Overall Financial Resource Strain (CARDIA)    Difficulty of Paying Living Expenses: Not hard at all  Food Insecurity: No Food Insecurity (08/05/2022)   Hunger Vital Sign    Worried About Running Out of Food in the Last Year: Never true    Ran Out of Food in the Last Year: Never true  Transportation Needs: No Transportation Needs (08/05/2022)   PRAPARE - Administrator, Civil ServiceTransportation    Lack of Transportation (Medical): No    Lack of Transportation (Non-Medical): No  Physical Activity: Insufficiently Active (08/05/2022)   Exercise Vital Sign    Days of Exercise per Week: 3 days    Minutes of Exercise per Session: 20 min  Stress: No Stress Concern Present (08/05/2022)   [employer] - Occupational Stress Questionnaire    Feeling of Stress : Not at all  Social Connections: Not on file    Tobacco Counseling Counseling given: Not Answered Tobacco comments: Not applicable.    Clinical Intake:  Pre-visit preparation completed: Yes  Pain : 0-10 Pain Score: 2  Pain Type: Acute pain Pain Location: Shoulder Pain Orientation: Left Pain Descriptors / Indicators: Aching Pain Onset: 1 to 4 weeks ago Pain Frequency: Intermittent     Nutritional Status: BMI > 30  Obese Nutritional Risks: None Diabetes: Yes  How often do you need to have someone help you when you read instructions, pamphlets, or other written materials from your doctor or pharmacy?: 1 - Never  Diabetic? Yes Nutrition Risk Assessment:  Has the patient had any N/V/D within the last 2 months?  No  Does the patient have any non-healing wounds?  No  Has the patient had any unintentional weight loss or weight gain?  No   Diabetes:  Is the patient diabetic?  Yes  If diabetic, was a CBG obtained today?  No  Did the patient bring in  their glucometer from home?  No  How often do you monitor your CBG's? Does not.   Financial Strains and Diabetes Management:  Are you having any financial strains with the device, your supplies or your medication? No .  Does the patient want to be seen by Chronic Care Management for management of their diabetes?  No  Would the patient like to be referred to a Nutritionist or for Diabetic Management?  No   Diabetic Exams:  Diabetic Eye Exam: Completed 06/12/2022 Diabetic Foot Exam: Completed 08/06/2021   Interpreter Needed?: No  Information entered by :: NAllen LPN   Activities of Daily  Living    08/05/2022    9:39 AM  In your present state of health, do you have any difficulty performing the following activities:  Hearing? 0  Vision? 0  Difficulty concentrating or making decisions? 0  Walking or climbing stairs? 0  Dressing or bathing? 0  Doing errands, shopping? 0  Preparing Food and eating ? N  Using the Toilet? N  In the past six months, have you accidently leaked urine? N  Do you have problems with loss of bowel control? N  Managing your Medications? N  Managing your Finances? N  Housekeeping or managing your Housekeeping? N    Patient Care Team: [name], MD as PCP - General (Internal Medicine)  Indicate any recent Medical Services you may have received from other than [facility] providers in the past year (date may be approximate).     Assessment:   This is a routine wellness examination for [place].  Hearing/Vision screen Vision Screening - Comments:: Regular eye exams, [facility]  Dietary issues and exercise activities discussed: Current Exercise Habits: Home exercise routine, Type of exercise: walking, Time (Minutes): 15, Frequency (Times/Week): 3, Weekly Exercise (Minutes/Week): 45   Goals Addressed             This Visit's Progress    Patient Stated       08/05/2022, wants to continue losing weight       Depression Screen     08/05/2022    9:39 AM 08/05/2022    9:02 AM 07/17/2021    2:37 PM 07/11/2020   11:12 AM 12/07/2019    3:45 PM 06/27/2019    3:00 PM 10/15/2018   10:56 AM  PHQ 2/9 Scores  PHQ - 2 Score 0 0 0 0 0 0 2  PHQ- 9 Score       2    Fall Risk    08/05/2022    9:39 AM 08/05/2022    9:01 AM 07/17/2021    2:37 PM 07/11/2020   11:11 AM 12/07/2019    3:45 PM  Fall Risk   Falls in the past year? 1 1 0 0 0  Comment tripped      Number falls in past yr: 0 0     Injury with Fall? 0 0     Risk for fall due to : Medication side effect No Fall Risks Medication side effect Medication side effect   Follow up Falls evaluation completed;Education provided;Falls prevention discussed Falls evaluation completed Falls evaluation completed;Education provided;Falls prevention discussed Falls evaluation completed;Education provided;Falls prevention discussed     FALL RISK PREVENTION PERTAINING TO THE HOME:  Any stairs in or around the home? Yes  If so, are there any without handrails? No  Home free of loose throw rugs in walkways, pet beds, electrical cords, etc? Yes  Adequate lighting in your home to reduce risk of falls? Yes   ASSISTIVE DEVICES UTILIZED TO PREVENT FALLS:  Life alert? No  Use of a cane, walker or w/c? No  Grab bars in the bathroom? No  Shower chair or bench in shower? Yes  Elevated toilet seat or a handicapped toilet? No   TIMED UP AND GO:  Was the test performed? Yes .  Length of time to ambulate 10 feet: 5 sec.   Gait steady and fast without use of assistive device  Cognitive Function:        08/05/2022    9:40 AM 07/17/2021    2:38 PM 07/11/2020   11:13 AM  06/27/2019    3:57 PM 06/27/2019    3:52 PM  6CIT Screen  What Year? 0 points 0 points 0 points 0 points 0 points  What month? 0 points 0 points 0 points 0 points 0 points  What time? 0 points 0 points 0 points 0 points 0 points  Count back from 20 0 points 0 points 0 points 0 points 0 points  Months in reverse 0 points 0  points 0 points 0 points 0 points  Repeat phrase 0 points 0 points 0 points 0 points 0 points  Total Score 0 points 0 points 0 points 0 points 0 points    Immunizations Immunization History  Administered Date(s) Administered   Fluad Quad(high Dose 65+) 07/11/2020, 08/06/2021   Influenza Inj Mdck Quad Pf 07/29/2018   Influenza, High Dose Seasonal PF 06/27/2019   Influenza,inj,Quad PF,6+ Mos 07/15/2017   Influenza-Unspecified 06/19/2018   PFIZER Comirnaty(Gray Top)Covid-19 Tri-Sucrose Vaccine 11/01/2020   PFIZER(Purple Top)SARS-COV-2 Vaccination 12/29/2019, 01/24/2020, 11/01/2020   PNEUMOCOCCAL CONJUGATE-20 11/10/2021   Pneumococcal Polysaccharide-23 11/09/2015    TDAP status: Up to date  Flu Vaccine status: Completed at today's visit  Pneumococcal vaccine status: Up to date  Covid-19 vaccine status: Completed vaccines  Qualifies for Shingles Vaccine? Yes   Zostavax completed No   Shingrix Completed?: No.    Education has been provided regarding the importance of this vaccine. Patient has been advised to call insurance company to determine out of pocket expense if they have not yet received this vaccine. Advised may also receive vaccine at local pharmacy or Health Dept. Verbalized acceptance and understanding.  Screening Tests Health Maintenance  Topic Date Due   COVID-19 Vaccine (5 - Pfizer risk series) 12/27/2020   INFLUENZA VACCINE  05/05/2022   MAMMOGRAM  05/13/2022   Medicare Annual Wellness (AWV)  07/17/2022   Zoster Vaccines- Shingrix (1 of 2) 11/05/2022 (Originally 04/19/1975)   FOOT EXAM  08/06/2022   HEMOGLOBIN A1C  09/11/2022   Diabetic kidney evaluation - GFR measurement  03/13/2023   Diabetic kidney evaluation - Urine ACR  03/13/2023   OPHTHALMOLOGY EXAM  06/13/2023   COLONOSCOPY (Pts 45-69yrs Insurance coverage will need to be confirmed)  02/26/2027   TETANUS/TDAP  04/30/2027   Pneumonia Vaccine 43+ Years old  Completed   DEXA SCAN  Completed   Hepatitis C  Screening  Completed   HPV VACCINES  Aged Out    Health Maintenance  Health Maintenance Due  Topic Date Due   COVID-19 Vaccine (5 - Pfizer risk series) 12/27/2020   INFLUENZA VACCINE  05/05/2022   MAMMOGRAM  05/13/2022   Medicare Annual Wellness (AWV)  07/17/2022    Colorectal cancer screening: Type of screening: Colonoscopy. Completed 02/25/2022. Repeat every 5 years  Mammogram status: scheduled for 08/07/2022  Bone Density status: Completed 05/13/2020.   Lung Cancer Screening: (Low Dose CT Chest recommended if Age 35-80 years, 30 pack-year currently smoking OR have quit w/in 15years.) does not qualify.   Lung Cancer Screening Referral: no  Additional Screening:  Hepatitis C Screening: does qualify; Completed 10/15/2018  Vision Screening: Recommended annual ophthalmology exams for early detection of glaucoma and other disorders of the eye. Is the patient up to date with their annual eye exam?  Yes  Who is the provider or what is the name of the office in which the patient attends annual eye exams? [facility] If pt is not established with a provider, would they like to be referred to a provider to establish care?  No .   Dental Screening: Recommended annual dental exams for proper oral hygiene  Community Resource Referral / Chronic Care Management: CRR required this visit?  No   CCM required this visit?  No      Plan:     I have personally reviewed and noted the following in the patient's chart:   Medical and social history Use of alcohol, tobacco or illicit drugs  Current medications and supplements including opioid prescriptions. Patient is not currently taking opioid prescriptions. Functional ability and status Nutritional status Physical activity Advanced directives List of other physicians Hospitalizations, surgeries, and ER visits in previous 12 months Vitals Screenings to include cognitive, depression, and falls Referrals and appointments  In  addition, I have reviewed and discussed with patient certain preventive protocols, quality metrics, and best practice recommendations. A written personalized care plan for preventive services as well as general preventive health recommendations were provided to patient.     Barb Merino, LPN   [id number]/04/[id number]   Nurse Notes: none

## 2022-08-06 LAB — CMP14+EGFR
ALT: 26 IU/L (ref 0–32)
AST: 26 IU/L (ref 0–40)
Albumin/Globulin Ratio: 1.8 (ref 1.2–2.2)
Albumin: 4.6 g/dL (ref 3.9–4.9)
Alkaline Phosphatase: 100 IU/L (ref 44–121)
BUN/Creatinine Ratio: 19 (ref 12–28)
BUN: 15 mg/dL (ref 8–27)
Bilirubin Total: 0.3 mg/dL (ref 0.0–1.2)
CO2: 21 mmol/L (ref 20–29)
Calcium: 10 mg/dL (ref 8.7–10.3)
Chloride: 103 mmol/L (ref 96–106)
Creatinine, Ser: 0.78 mg/dL (ref 0.57–1.00)
Globulin, Total: 2.6 g/dL (ref 1.5–4.5)
Glucose: 198 mg/dL — ABNORMAL HIGH (ref 70–99)
Potassium: 4.2 mmol/L (ref 3.5–5.2)
Sodium: 138 mmol/L (ref 134–144)
Total Protein: 7.2 g/dL (ref 6.0–8.5)
eGFR: 83 mL/min/{1.73_m2} (ref >59)

## 2022-08-06 LAB — HEMOGLOBIN A1C
Est. average glucose Bld gHb Est-mCnc: 143 mg/dL
Hgb A1c MFr Bld: 6.6 % — ABNORMAL HIGH (ref 4.8–5.6)

## 2022-08-07 DIAGNOSIS — Z1231 Encounter for screening mammogram for malignant neoplasm of breast: Secondary | ICD-10-CM | POA: Diagnosis not present

## 2022-08-07 LAB — HM MAMMOGRAPHY: HM Mammogram: NORMAL (ref 0–4)

## 2022-09-02 ENCOUNTER — Ambulatory Visit: Payer: Medicare Other

## 2022-09-02 ENCOUNTER — Ambulatory Visit: Payer: Medicare Other | Admitting: Internal Medicine

## 2022-09-20 ENCOUNTER — Other Ambulatory Visit: Payer: Self-pay | Admitting: Internal Medicine

## 2022-09-21 MED ORDER — CLONAZEPAM 1 MG PO TABS
1.0000 mg | ORAL_TABLET | Freq: Every day | ORAL | 0 refills | Status: DC | PRN
Start: 2022-09-21 — End: 2022-11-17

## 2022-11-01 ENCOUNTER — Other Ambulatory Visit: Payer: Self-pay | Admitting: Internal Medicine

## 2022-11-02 ENCOUNTER — Other Ambulatory Visit: Payer: Self-pay

## 2022-11-02 MED ORDER — ROSUVASTATIN CALCIUM 10 MG PO TABS: 10.0000 mg | ORAL_TABLET | Freq: Every day | ORAL | 0 refills | Status: DC

## 2022-11-16 ENCOUNTER — Other Ambulatory Visit: Payer: Self-pay | Admitting: Internal Medicine

## 2022-11-24 ENCOUNTER — Other Ambulatory Visit: Payer: Self-pay | Admitting: Internal Medicine

## 2023-01-03 ENCOUNTER — Telehealth: Payer: Medicare Other | Admitting: Nurse Practitioner

## 2023-01-03 DIAGNOSIS — B029 Zoster without complications: Secondary | ICD-10-CM | POA: Diagnosis not present

## 2023-01-03 MED ORDER — VALACYCLOVIR HCL 1 G PO TABS: 1000.0000 mg | ORAL_TABLET | Freq: Three times a day (TID) | ORAL | 0 refills | Status: AC

## 2023-01-03 NOTE — Progress Notes (Signed)
E-visit for Shingles   We are sorry that you are not feeling well. Here is how we plan to help!  Based on the location of your rash it is important you have a follow up this week with your primary care provider, and also with an eye doctor. Shingles on your face can involve your eyes.  We will start the anti-viral today since many offices are closed.   Based on what you shared with me it looks like you have shingles.  Shingles or herpes zoster, is a common infection of the nerves.  It is a painful rash caused by the herpes zoster virus.  This is the same virus that causes chickenpox.  After a person has chickenpox, the virus remains inactive in the nerve cells.  Years later, the virus can become active again and travel to the skin.  It typically will appear on one side of the face or body.  Burning or shooting pain, tingling, or itching are early signs of the infection.  Blisters typically scab over in 7 to 10 days and clear up within 2-4 weeks. Shingles is only contagious to people that have never had the chickenpox, the chickenpox vaccine, or anyone who has a compromised immune system.  You should avoid contact with these type of people until your blisters scab over.  I have prescribed Valacyclovir 1g three times daily for 7 days    HOME CARE: Apply ice packs (wrapped in a thin towel), cool compresses, or soak in cool bath to help reduce pain. Use calamine lotion to calm itchy skin. Avoid scratching the rash. Avoid direct sunlight.  GET HELP RIGHT AWAY IF: Symptoms that don't away after treatment. A rash or blisters near your eye. Increased drainage, fever, or rash after treatment. Severe pain that doesn't go away.   MAKE SURE YOU   Understand these instructions. Will watch your condition. Will get help right away if you are not doing well or get worse.  Thank you for choosing an e-visit.  Your e-visit answers were reviewed by a board certified advanced clinical practitioner to  complete your personal care plan. Depending upon the condition, your plan could have included both over the counter or prescription medications.  Please review your pharmacy choice. Make sure the pharmacy is open so you can pick up prescription now. If there is a problem, you may contact your provider through [employer] and have the prescription routed to another pharmacy.  Your safety is important to [place]. If you have drug allergies check your prescription carefully.   For the next 24 hours you can use MyChart to ask questions about today's visit, request a non-urgent call back, or ask for a work or school excuse. You will get an email in the next two days asking about your experience. I hope that your e-visit has been valuable and will speed your recovery.   Meds ordered this encounter  Medications   valACYclovir (VALTREX) 1000 MG tablet    Sig: Take 1 tablet (1,000 mg total) by mouth 3 (three) times daily for 7 days.    Dispense:  21 tablet    Refill:  0    I spent approximately 5 minutes reviewing the patient's history, current symptoms and coordinating their care today.

## 2023-01-27 ENCOUNTER — Other Ambulatory Visit: Payer: Self-pay | Admitting: Internal Medicine

## 2023-02-01 ENCOUNTER — Other Ambulatory Visit: Payer: Self-pay | Admitting: Internal Medicine

## 2023-02-01 ENCOUNTER — Encounter: Payer: Self-pay | Admitting: Internal Medicine

## 2023-02-01 ENCOUNTER — Ambulatory Visit (INDEPENDENT_AMBULATORY_CARE_PROVIDER_SITE_OTHER): Payer: Medicare Other | Admitting: Internal Medicine

## 2023-02-01 VITALS — BP 118/82 | HR 77 | Temp 98.5°F | Ht 61.0 in | Wt 190.8 lb

## 2023-02-01 DIAGNOSIS — Z0000 Encounter for general adult medical examination without abnormal findings: Secondary | ICD-10-CM

## 2023-02-01 DIAGNOSIS — R82998 Other abnormal findings in urine: Secondary | ICD-10-CM

## 2023-02-01 DIAGNOSIS — E785 Hyperlipidemia, unspecified: Secondary | ICD-10-CM

## 2023-02-01 DIAGNOSIS — E1169 Type 2 diabetes mellitus with other specified complication: Secondary | ICD-10-CM

## 2023-02-01 DIAGNOSIS — Z0001 Encounter for general adult medical examination with abnormal findings: Secondary | ICD-10-CM

## 2023-02-01 DIAGNOSIS — E66812 Obesity, class 2: Secondary | ICD-10-CM

## 2023-02-01 DIAGNOSIS — R8290 Unspecified abnormal findings in urine: Secondary | ICD-10-CM

## 2023-02-01 DIAGNOSIS — F419 Anxiety disorder, unspecified: Secondary | ICD-10-CM

## 2023-02-01 DIAGNOSIS — E2839 Other primary ovarian failure: Secondary | ICD-10-CM

## 2023-02-01 DIAGNOSIS — R9431 Abnormal electrocardiogram [ECG] [EKG]: Secondary | ICD-10-CM

## 2023-02-01 DIAGNOSIS — Z6836 Body mass index (BMI) 36.0-36.9, adult: Secondary | ICD-10-CM

## 2023-02-01 LAB — POCT URINALYSIS DIPSTICK
Bilirubin, UA: NEGATIVE
Glucose, UA: POSITIVE — AB
Ketones, UA: NEGATIVE
Leukocytes, UA: NEGATIVE
Nitrite, UA: POSITIVE
Protein, UA: NEGATIVE
Spec Grav, UA: 1.025 (ref 1.010–1.025)
Urobilinogen, UA: 0.2 E.U./dL
pH, UA: 5.5 (ref 5.0–8.0)

## 2023-02-01 MED ORDER — CLONAZEPAM 1 MG PO TABS: 1.0000 mg | ORAL_TABLET | Freq: Every day | ORAL | 0 refills | Status: DC | PRN

## 2023-02-01 NOTE — Progress Notes (Signed)
I,Anne Woodard,acting as a scribe for Anne Aliment, MD.,have documented all relevant documentation on the behalf of Anne Aliment, MD,as directed by  Anne Aliment, MD while in the presence of Anne Aliment, MD.   Subjective:     Patient ID: Anne Woodard , female    DOB: December 27, 1955 , 67 y.o.   MRN: [id number]   Chief Complaint  Patient presents with   Annual Exam   Diabetes    HPI  Patient presents today for annual exam. GYN: [facility] located in [place]. She reports she had a shingles outbreak 6 weeks ago, she was treated w/ antivirals and is confident her sx started due to stress.   She has no specific questions or concerns at this time.     Diabetes She presents for her follow-up diabetic visit. She has type 2 diabetes mellitus. Her disease course has been stable. There are no hypoglycemic associated symptoms. Pertinent negatives for hypoglycemia include no headaches. Pertinent negatives for diabetes include no chest pain, no fatigue, no polydipsia, no polyphagia, no polyuria and no weakness. There are no hypoglycemic complications. Symptoms are worsening. Risk factors for coronary artery disease include diabetes mellitus, hypertension, obesity, sedentary lifestyle and post-menopausal. Her breakfast blood glucose range is generally 110-130 mg/dl. Eye exam is not current.     Past Medical History:  Diagnosis Date   Abnormal EKG 01/14/2018   Arthritis    Right hip   Depression    Diabetes mellitus type 2 in obese 01/14/2018   Motion sickness    Boats   Multilevel degenerative disc disease    Pure hypercholesterolemia 01/14/2018   Vertigo      Family History  Problem Relation Age of Onset   Colon cancer Mother    Endometrial cancer Mother    Hypertension Mother    Alzheimer's disease Father    Multiple sclerosis Son    Hypertension Son    Depression Son    Anxiety disorder Son    Breast cancer Neg Hx      Current Outpatient Medications:     doxylamine, Sleep, (UNISOM) 25 MG tablet, Take 25 mg by mouth at bedtime as needed., Disp: , Rfl:    escitalopram (LEXAPRO) 10 MG tablet, TAKE 1 TABLET BY MOUTH EVERY DAY, Disp: 90 tablet, Rfl: 1   FARXIGA 10 MG TABS tablet, TAKE 1 TABLET BY MOUTH EVERY DAY, Disp: 90 tablet, Rfl: 1   losartan (COZAAR) 25 MG tablet, TAKE 1 TABLET BY MOUTH EVERY DAY, Disp: 90 tablet, Rfl: 2   Multiple Vitamin (MULTIVITAMIN) tablet, Take 1 tablet by mouth daily., Disp: , Rfl:    nitrofurantoin, macrocrystal-monohydrate, (MACROBID) 100 MG capsule, Take 1 capsule (100 mg total) by mouth 2 (two) times daily for 7 days., Disp: 14 capsule, Rfl: 0   rosuvastatin (CRESTOR) 10 MG tablet, TAKE 1 TABLET BY MOUTH EVERY DAY, Disp: 90 tablet, Rfl: 1   clonazePAM (KLONOPIN) 1 MG tablet, Take 1 tablet (1 mg total) by mouth daily as needed., Disp: 30 tablet, Rfl: 0   glucose blood (ONETOUCH VERIO) test strip, Use as directed to check blood sugars daily dx: e11.69 (Patient not taking: Reported on 08/05/2022), Disp: 100 each, Rfl: 2   No Known Allergies    The patient states she uses post menopausal status for birth control. Last LMP was No LMP recorded. Patient has had a hysterectomy.. Negative for Dysmenorrhea. Negative for: breast discharge, breast lump(s), breast pain and breast self exam. Associated symptoms include  abnormal vaginal bleeding. Pertinent negatives include abnormal bleeding (hematology), anxiety, decreased libido, depression, difficulty falling sleep, dyspareunia, history of infertility, nocturia, sexual dysfunction, sleep disturbances, urinary incontinence, urinary urgency, vaginal discharge and vaginal itching. Diet regular.The patient states her exercise level is intermittent.    . The patient's tobacco use is:  Social History   Tobacco Use  Smoking Status Never  Smokeless Tobacco Never  Tobacco Comments   Not applicable.   . She has been exposed to passive smoke. The patient's alcohol use is:  Social History    Substance and Sexual Activity  Alcohol Use No    Review of Systems  Constitutional: Negative.  Negative for fatigue.  HENT: Negative.    Eyes: Negative.   Respiratory: Negative.    Cardiovascular: Negative.  Negative for chest pain.  Gastrointestinal: Negative.   Endocrine: Negative.  Negative for polydipsia, polyphagia and polyuria.  Genitourinary: Negative.   Musculoskeletal: Negative.   Skin: Negative.   Allergic/Immunologic: Negative.   Neurological: Negative.  Negative for weakness and headaches.  Hematological: Negative.   Psychiatric/Behavioral: Negative.       Today's Vitals   02/01/23 1115  BP: 118/82  Pulse: 77  Temp: 98.5 F (36.9 C)  SpO2: 98%  Weight: 190 lb 12.8 oz (86.5 kg)  Height: 5\' 1"  (1.549 m)   Body mass index is 36.05 kg/m.  Wt Readings from Last 3 Encounters:  02/01/23 190 lb 12.8 oz (86.5 kg)  08/05/22 180 lb (81.6 kg)  08/05/22 180 lb 9.6 oz (81.9 kg)    Objective:  Physical Exam Vitals and nursing note reviewed.  Constitutional:      Appearance: Normal appearance. She is obese.  HENT:     Head: Normocephalic and atraumatic.     Right Ear: Tympanic membrane, ear canal and external ear normal.     Left Ear: Tympanic membrane, ear canal and external ear normal.     Mouth/Throat:     Mouth: Mucous membranes are moist.     Pharynx: Oropharynx is clear.  Eyes:     Extraocular Movements: Extraocular movements intact.     Conjunctiva/sclera: Conjunctivae normal.     Pupils: Pupils are equal, round, and reactive to light.  Cardiovascular:     Rate and Rhythm: Normal rate and regular rhythm.     Pulses: Normal pulses.          Dorsalis pedis pulses are 2+ on the right side and 2+ on the left side.     Heart sounds: Normal heart sounds.  Pulmonary:     Effort: Pulmonary effort is normal.     Breath sounds: Normal breath sounds.  Chest:  Breasts:    Tanner Score is 5.     Right: Normal.     Left: Normal.  Abdominal:     General:  Bowel sounds are normal.     Palpations: Abdomen is soft.  Genitourinary:    Comments: deferred Musculoskeletal:        General: Normal range of motion.     Cervical back: Normal range of motion and neck supple.  Feet:     Right foot:     Protective Sensation: 5 sites tested.  5 sites sensed.     Toenail Condition: Right toenails are normal.     Left foot:     Protective Sensation: 5 sites tested.  5 sites sensed.     Toenail Condition: Left toenails are normal.  Skin:    General: Skin is warm and dry.  Neurological:     General: No focal deficit present.     Mental Status: She is alert and oriented to person, place, and time.  Psychiatric:        Mood and Affect: Mood normal.        Behavior: Behavior normal.         Assessment And Plan:     1. Encounter for annual health examination Comments: A full exam was performed. Importance of monthly self breast exams was discussed with the patient.  PATIENT IS ADVISED TO GET 30-45 MINUTES REGULAR EXERCISE NO LESS THAN FOUR TO FIVE DAYS PER WEEK - BOTH WEIGHTBEARING EXERCISES AND AEROBIC ARE RECOMMENDED.  PATIENT IS ADVISED TO FOLLOW A HEALTHY DIET WITH AT LEAST SIX FRUITS/VEGGIES PER DAY, DECREASE INTAKE OF RED MEAT, AND TO INCREASE FISH INTAKE TO TWO DAYS PER WEEK.  MEATS/FISH SHOULD NOT BE FRIED, BAKED OR BROILED IS PREFERABLE.  IT IS ALSO IMPORTANT TO CUT BACK ON YOUR SUGAR INTAKE. PLEASE AVOID ANYTHING WITH ADDED SUGAR, CORN SYRUP OR OTHER SWEETENERS. IF YOU MUST USE A SWEETENER, YOU CAN TRY STEVIA. IT IS ALSO IMPORTANT TO AVOID ARTIFICIALLY SWEETENERS AND DIET BEVERAGES. LASTLY, I SUGGEST WEARING SPF 50 SUNSCREEN ON EXPOSED PARTS AND ESPECIALLY WHEN IN THE DIRECT SUNLIGHT FOR AN EXTENDED PERIOD OF TIME.  PLEASE AVOID FAST FOOD RESTAURANTS AND INCREASE YOUR WATER INTAKE.  2. Dyslipidemia associated with type 2 diabetes mellitus (HCC) Comments: Chronic, diabetic foot exam was performed. LDL goal < 70.  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  I DISCUSSED WITH THE PATIENT AT LENGTH REGARDING THE GOALS OF GLYCEMIC CONTROL AND POSSIBLE LONG-TERM COMPLICATIONS.  I  ALSO STRESSED THE IMPORTANCE OF COMPLIANCE WITH HOME GLUCOSE MONITORING, DIETARY RESTRICTIONS INCLUDING AVOIDANCE OF SUGARY DRINKS/PROCESSED FOODS,  ALONG WITH REGULAR EXERCISE.  I  ALSO STRESSED THE IMPORTANCE OF ANNUAL EYE EXAMS, SELF FOOT CARE AND COMPLIANCE WITH OFFICE VISITS.  - POCT Urinalysis Dipstick (81002) - EKG 12-Lead - Microalbumin / creatinine urine ratio - CMP14+EGFR - CBC - Hemoglobin A1c - Lipid panel - TSH  3. 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 She agrees to Cardiology referral, prefers to go to [facility]. - Ambulatory referral to Cardiology  4. Abnormal urinalysis Comments: Nitrite POS, I will send in urine culture and treat accordingly. - Culture, Urine  5. Estrogen deficiency Comments: Will schedule for dexa in [place]. - DG Bone Density; Future  6. Anxiety Comments: Chronic, she will c/w clonazapam prn and escitalopram.  7. Class 2 severe obesity due to excess calories with serious comorbidity and body mass index (BMI) of 36.0 to 36.9 in adult [facility]) Comments: She is encouraged to aim for at least 150 minutes of exercise/week, while striving for BMI<30 to decrease cardiac risk.   Patient was given opportunity to ask questions. Patient verbalized understanding of the plan and was able to repeat key elements of the plan. All questions were answered to their satisfaction.   I, Anne Aliment, MD, have reviewed all documentation for this visit. The documentation on 02/01/23 for the exam, diagnosis, procedures, and orders are all accurate and complete  THE PATIENT IS ENCOURAGED TO PRACTICE SOCIAL DISTANCING DUE TO THE COVID-19 PANDEMIC.

## 2023-02-01 NOTE — Patient Instructions (Signed)

## 2023-02-02 ENCOUNTER — Encounter: Payer: Self-pay | Admitting: Internal Medicine

## 2023-02-02 LAB — CMP14+EGFR
ALT: 32 IU/L (ref 0–32)
AST: 32 IU/L (ref 0–40)
Albumin/Globulin Ratio: 1.6 (ref 1.2–2.2)
Albumin: 4.4 g/dL (ref 3.9–4.9)
Alkaline Phosphatase: 112 IU/L (ref 44–121)
BUN/Creatinine Ratio: 16 (ref 12–28)
BUN: 11 mg/dL (ref 8–27)
Bilirubin Total: 0.4 mg/dL (ref 0.0–1.2)
CO2: 20 mmol/L (ref 20–29)
Calcium: 9.3 mg/dL (ref 8.7–10.3)
Chloride: 102 mmol/L (ref 96–106)
Creatinine, Ser: 0.67 mg/dL (ref 0.57–1.00)
Globulin, Total: 2.8 g/dL (ref 1.5–4.5)
Glucose: 135 mg/dL — ABNORMAL HIGH (ref 70–99)
Potassium: 4.3 mmol/L (ref 3.5–5.2)
Sodium: 138 mmol/L (ref 134–144)
Total Protein: 7.2 g/dL (ref 6.0–8.5)
eGFR: 95 mL/min/{1.73_m2} (ref >59)

## 2023-02-02 LAB — CBC
Hematocrit: 46.2 % (ref 34.0–46.6)
Hemoglobin: 15.2 g/dL (ref 11.1–15.9)
MCH: 31 pg (ref 26.6–33.0)
MCHC: 32.9 g/dL (ref 31.5–35.7)
MCV: 94 fL (ref 79–97)
Platelets: 233 10*3/uL (ref 150–450)
RBC: 4.91 x10E6/uL (ref 3.77–5.28)
RDW: 13.1 % (ref 11.7–15.4)
WBC: 6.3 10*3/uL (ref 3.4–10.8)

## 2023-02-02 LAB — LIPID PANEL
Chol/HDL Ratio: 3.1 ratio (ref 0.0–4.4)
Cholesterol, Total: 148 mg/dL (ref 100–199)
HDL: 48 mg/dL (ref >39)
LDL Chol Calc (NIH): 79 mg/dL (ref 0–99)
Triglycerides: 117 mg/dL (ref 0–149)
VLDL Cholesterol Cal: 21 mg/dL (ref 5–40)

## 2023-02-02 LAB — MICROALBUMIN / CREATININE URINE RATIO
Creatinine, Urine: 92 mg/dL
Microalb/Creat Ratio: 6 mg/g creat (ref 0–29)
Microalbumin, Urine: 5.2 ug/mL

## 2023-02-02 LAB — HEMOGLOBIN A1C
Est. average glucose Bld gHb Est-mCnc: 160 mg/dL
Hgb A1c MFr Bld: 7.2 % — ABNORMAL HIGH (ref 4.8–5.6)

## 2023-02-02 LAB — TSH: TSH: 1.34 u[IU]/mL (ref 0.450–4.500)

## 2023-02-04 ENCOUNTER — Other Ambulatory Visit: Payer: Self-pay | Admitting: Internal Medicine

## 2023-02-04 LAB — URINE CULTURE

## 2023-02-04 MED ORDER — NITROFURANTOIN MONOHYD MACRO 100 MG PO CAPS: 100.0000 mg | ORAL_CAPSULE | Freq: Two times a day (BID) | ORAL | 0 refills | Status: AC

## 2023-02-14 ENCOUNTER — Other Ambulatory Visit: Payer: Self-pay | Admitting: Internal Medicine

## 2023-03-18 ENCOUNTER — Other Ambulatory Visit: Payer: Self-pay | Admitting: Internal Medicine

## 2023-03-18 DIAGNOSIS — Z1231 Encounter for screening mammogram for malignant neoplasm of breast: Secondary | ICD-10-CM

## 2023-04-06 ENCOUNTER — Other Ambulatory Visit: Payer: Self-pay | Admitting: Internal Medicine

## 2023-04-09 DIAGNOSIS — R0789 Other chest pain: Secondary | ICD-10-CM | POA: Diagnosis not present

## 2023-04-09 DIAGNOSIS — R0602 Shortness of breath: Secondary | ICD-10-CM | POA: Diagnosis not present

## 2023-04-09 DIAGNOSIS — Z7689 Persons encountering health services in other specified circumstances: Secondary | ICD-10-CM | POA: Diagnosis not present

## 2023-04-09 DIAGNOSIS — E7800 Pure hypercholesterolemia, unspecified: Secondary | ICD-10-CM | POA: Diagnosis not present

## 2023-04-09 DIAGNOSIS — I4510 Unspecified right bundle-branch block: Secondary | ICD-10-CM | POA: Diagnosis not present

## 2023-04-09 DIAGNOSIS — I10 Essential (primary) hypertension: Secondary | ICD-10-CM | POA: Diagnosis not present

## 2023-04-13 ENCOUNTER — Other Ambulatory Visit: Payer: Self-pay | Admitting: Internal Medicine

## 2023-04-29 DIAGNOSIS — I4510 Unspecified right bundle-branch block: Secondary | ICD-10-CM | POA: Diagnosis not present

## 2023-04-29 DIAGNOSIS — R0602 Shortness of breath: Secondary | ICD-10-CM | POA: Diagnosis not present

## 2023-05-02 ENCOUNTER — Other Ambulatory Visit: Payer: Self-pay | Admitting: Internal Medicine

## 2023-05-04 ENCOUNTER — Other Ambulatory Visit: Payer: Self-pay | Admitting: Internal Medicine

## 2023-05-05 DIAGNOSIS — R0789 Other chest pain: Secondary | ICD-10-CM | POA: Diagnosis not present

## 2023-05-05 DIAGNOSIS — I4510 Unspecified right bundle-branch block: Secondary | ICD-10-CM | POA: Diagnosis not present

## 2023-05-05 DIAGNOSIS — E7800 Pure hypercholesterolemia, unspecified: Secondary | ICD-10-CM | POA: Diagnosis not present

## 2023-05-05 DIAGNOSIS — R0602 Shortness of breath: Secondary | ICD-10-CM | POA: Diagnosis not present

## 2023-05-05 DIAGNOSIS — I10 Essential (primary) hypertension: Secondary | ICD-10-CM | POA: Diagnosis not present

## 2023-05-19 ENCOUNTER — Ambulatory Visit: Payer: Medicare Other | Admitting: Internal Medicine

## 2023-06-08 ENCOUNTER — Encounter: Payer: Self-pay | Admitting: Internal Medicine

## 2023-06-08 ENCOUNTER — Ambulatory Visit (INDEPENDENT_AMBULATORY_CARE_PROVIDER_SITE_OTHER): Payer: Medicare Other | Admitting: Internal Medicine

## 2023-06-08 VITALS — BP 118/84 | HR 85 | Temp 98.3°F | Ht 61.0 in | Wt 195.2 lb

## 2023-06-08 DIAGNOSIS — F5101 Primary insomnia: Secondary | ICD-10-CM | POA: Diagnosis not present

## 2023-06-08 DIAGNOSIS — F419 Anxiety disorder, unspecified: Secondary | ICD-10-CM | POA: Diagnosis not present

## 2023-06-08 DIAGNOSIS — Z6836 Body mass index (BMI) 36.0-36.9, adult: Secondary | ICD-10-CM

## 2023-06-08 DIAGNOSIS — E785 Hyperlipidemia, unspecified: Secondary | ICD-10-CM | POA: Diagnosis not present

## 2023-06-08 DIAGNOSIS — E1169 Type 2 diabetes mellitus with other specified complication: Secondary | ICD-10-CM | POA: Diagnosis not present

## 2023-06-08 DIAGNOSIS — Z23 Encounter for immunization: Secondary | ICD-10-CM

## 2023-06-08 MED ORDER — ESCITALOPRAM OXALATE 20 MG PO TABS: 20.0000 mg | ORAL_TABLET | Freq: Every day | ORAL | 1 refills | Status: DC

## 2023-06-08 MED ORDER — ESZOPICLONE 3 MG PO TABS: 3.0000 mg | ORAL_TABLET | Freq: Every evening | ORAL | 1 refills | Status: DC | PRN

## 2023-06-08 NOTE — Patient Instructions (Signed)

## 2023-06-08 NOTE — Assessment & Plan Note (Signed)
She is encouraged to strive for BMI less than 30 to decrease cardiac risk. Advised to aim for at least 150 minutes of exercise per week.  

## 2023-06-08 NOTE — Progress Notes (Signed)
I,Victoria T Deloria Lair, CMA,acting as a Neurosurgeon for Gwynneth Aliment, MD.,have documented all relevant documentation on the behalf of Gwynneth Aliment, MD,as directed by  Gwynneth Aliment, MD while in the presence of Gwynneth Aliment, MD.  Subjective:  Patient ID: Anne Woodard , female    DOB: May 30, 1956 , 67 y.o.   MRN: [id number]  Chief Complaint  Patient presents with   Diabetes    HPI  The patient is here today for a follow-up on her diabetes.  She reports compliance with meds. She denies headaches, chest pain and shortness of breath.  She reports she has stopped [place]. She states she stopped taking at least a month ago. She experienced rash, yeast infection and urinary urgency.   She also reports taking Rosuvastatin once a week. She admits experiencing aches.    Diabetes She presents for her follow-up diabetic visit. She has type 2 diabetes mellitus. Her disease course has been stable. There are no hypoglycemic associated symptoms. Pertinent negatives for hypoglycemia include no headaches. Pertinent negatives for diabetes include no chest pain, no fatigue, no polydipsia, no polyphagia, no polyuria and no weakness. There are no hypoglycemic complications. Symptoms are worsening. Risk factors for coronary artery disease include diabetes mellitus, hypertension, obesity, sedentary lifestyle and post-menopausal. Her breakfast blood glucose range is generally 110-130 mg/dl. Eye exam is not current.     Past Medical History:  Diagnosis Date   Abnormal EKG 01/14/2018   Arthritis    Right hip   Depression    Diabetes mellitus type 2 in obese 01/14/2018   Motion sickness    Boats   Multilevel degenerative disc disease    Pure hypercholesterolemia 01/14/2018   Vertigo      Family History  Problem Relation Age of Onset   Colon cancer Mother    Endometrial cancer Mother    Hypertension Mother    Alzheimer's disease Father    Multiple sclerosis Son    Hypertension Son    Depression Son     Anxiety disorder Son    Breast cancer Neg Hx      Current Outpatient Medications:    clonazePAM (KLONOPIN) 1 MG tablet, TAKE 1 TABLET BY MOUTH DAILY AS NEEDED., Disp: 30 tablet, Rfl: 0   doxylamine, Sleep, (UNISOM) 25 MG tablet, Take 25 mg by mouth at bedtime as needed., Disp: , Rfl:    escitalopram (LEXAPRO) 20 MG tablet, Take 1 tablet (20 mg total) by mouth daily., Disp: 90 tablet, Rfl: 1   eszopiclone 3 MG TABS, Take 1 tablet (3 mg total) by mouth at bedtime as needed. Take immediately before bedtime, Disp: 30 tablet, Rfl: 1   losartan (COZAAR) 25 MG tablet, TAKE 1 TABLET BY MOUTH EVERY DAY, Disp: 90 tablet, Rfl: 2   Multiple Vitamin (MULTIVITAMIN) tablet, Take 1 tablet by mouth daily., Disp: , Rfl:    rosuvastatin (CRESTOR) 10 MG tablet, TAKE 1 TABLET BY MOUTH EVERY DAY, Disp: 90 tablet, Rfl: 1   glucose blood (ONETOUCH VERIO) test strip, Use as directed to check blood sugars daily dx: e11.69 (Patient not taking: Reported on 08/05/2022), Disp: 100 each, Rfl: 2   No Known Allergies   Review of Systems  Constitutional: Negative.  Negative for fatigue.  Respiratory: Negative.    Cardiovascular: Negative.  Negative for chest pain.  Gastrointestinal: Negative.   Endocrine: Negative for polydipsia, polyphagia and polyuria.  Neurological: Negative.  Negative for weakness and headaches.  Psychiatric/Behavioral: Negative.       Today's  Vitals   06/08/23 1403  BP: 118/84  Pulse: 85  Temp: 98.3 F (36.8 C)  SpO2: 98%  Weight: 195 lb 3.2 oz (88.5 kg)  Height: 5\' 1"  (1.549 m)   Body mass index is 36.88 kg/m.  Wt Readings from Last 3 Encounters:  06/08/23 195 lb 3.2 oz (88.5 kg)  02/01/23 190 lb 12.8 oz (86.5 kg)  08/05/22 180 lb (81.6 kg)     Objective:  Physical Exam Vitals and nursing note reviewed.  Constitutional:      Appearance: Normal appearance.  HENT:     Head: Normocephalic and atraumatic.  Eyes:     Extraocular Movements: Extraocular movements intact.   Cardiovascular:     Rate and Rhythm: Normal rate and regular rhythm.     Heart sounds: Normal heart sounds.  Pulmonary:     Effort: Pulmonary effort is normal.     Breath sounds: Normal breath sounds.  Musculoskeletal:     Cervical back: Normal range of motion.  Skin:    General: Skin is warm.  Neurological:     General: No focal deficit present.     Mental Status: She is alert.  Psychiatric:        Mood and Affect: Mood normal.        Behavior: Behavior normal.         Assessment And Plan:  Dyslipidemia associated with type 2 diabetes mellitus (HCC) Assessment & Plan: Chronic, LDL goal is less than 70. Unfortunately, she is intolerant of [place]. She was given samples of Jardiance 10mg  to take once daily. Agrees to RTC in four to six weeks for re-evaluation. I will check labs as below.   Orders: -     CMP14+EGFR -     Hemoglobin A1c  Anxiety Assessment & Plan: Due to worsening symptoms, I will increase dose of escitalopram to 20mg  daily. She agres to f/u in six weeks for virtual visit.  PDMP reviewed, she was also given a refill of clonazepam.   Orders: -     Ambulatory referral to Psychology  Primary insomnia Assessment & Plan: Chronic, reminded to create a bedtime routine. I will send rx Lunesta 3mg  nightly. She has taken this in the past with improvement of her sx. She agrees to f/u in six weeks for re-evaluation.    Class 2 severe obesity due to excess calories with serious comorbidity and body mass index (BMI) of 36.0 to 36.9 in adult [facility]) Assessment & Plan: She is encouraged to strive for BMI less than 30 to decrease cardiac risk. Advised to aim for at least 150 minutes of exercise per week.    Immunization due -     Flu Vaccine Trivalent High Dose (Fluad)  Other orders -     Escitalopram Oxalate; Take 1 tablet (20 mg total) by mouth daily.  Dispense: 90 tablet; Refill: 1 -     Eszopiclone; Take 1 tablet (3 mg total) by mouth at bedtime as needed. Take  immediately before bedtime  Dispense: 30 tablet; Refill: 1     Return in about 6 weeks (around 07/20/2023), or virtual visit, for move Nov visit with RS to December, keep AWV in November.  Patient was given opportunity to ask questions. Patient verbalized understanding of the plan and was able to repeat key elements of the plan. All questions were answered to their satisfaction.    I, Gwynneth Aliment, MD, have reviewed all documentation for this visit. The documentation on 06/08/23 for the exam, diagnosis,  procedures, and orders are all accurate and complete.   IF YOU HAVE BEEN REFERRED TO A SPECIALIST, IT MAY TAKE 1-2 WEEKS TO SCHEDULE/PROCESS THE REFERRAL. IF YOU HAVE NOT HEARD FROM US/SPECIALIST IN TWO WEEKS, PLEASE GIVE [place] A CALL AT [phone].   THE PATIENT IS ENCOURAGED TO PRACTICE SOCIAL DISTANCING DUE TO THE COVID-19 PANDEMIC.

## 2023-06-08 NOTE — Assessment & Plan Note (Signed)
Due to worsening symptoms, I will increase dose of escitalopram to 20mg  daily. She agres to f/u in six weeks for virtual visit.  PDMP reviewed, she was also given a refill of clonazepam.

## 2023-06-09 LAB — CMP14+EGFR
ALT: 48 IU/L — ABNORMAL HIGH (ref 0–32)
AST: 42 IU/L — ABNORMAL HIGH (ref 0–40)
Albumin: 4.3 g/dL (ref 3.9–4.9)
Alkaline Phosphatase: 121 IU/L (ref 44–121)
BUN/Creatinine Ratio: 17 (ref 12–28)
BUN: 11 mg/dL (ref 8–27)
Bilirubin Total: 0.3 mg/dL (ref 0.0–1.2)
CO2: 20 mmol/L (ref 20–29)
Calcium: 10 mg/dL (ref 8.7–10.3)
Chloride: 101 mmol/L (ref 96–106)
Creatinine, Ser: 0.64 mg/dL (ref 0.57–1.00)
Globulin, Total: 3 g/dL (ref 1.5–4.5)
Glucose: 151 mg/dL — ABNORMAL HIGH (ref 70–99)
Potassium: 4.3 mmol/L (ref 3.5–5.2)
Sodium: 138 mmol/L (ref 134–144)
Total Protein: 7.3 g/dL (ref 6.0–8.5)
eGFR: 96 mL/min/{1.73_m2} (ref >59)

## 2023-06-09 LAB — HEMOGLOBIN A1C
Est. average glucose Bld gHb Est-mCnc: 189 mg/dL
Hgb A1c MFr Bld: 8.2 % — ABNORMAL HIGH (ref 4.8–5.6)

## 2023-06-15 DIAGNOSIS — F5101 Primary insomnia: Secondary | ICD-10-CM | POA: Insufficient documentation

## 2023-06-15 NOTE — Assessment & Plan Note (Signed)
Chronic, reminded to create a bedtime routine. I will send rx Lunesta 3mg  nightly. She has taken this in the past with improvement of her sx. She agrees to f/u in six weeks for re-evaluation.

## 2023-06-15 NOTE — Assessment & Plan Note (Signed)
Chronic, LDL goal is less than 70. Unfortunately, she is intolerant of [place]. She was given samples of Jardiance 10mg  to take once daily. Agrees to RTC in four to six weeks for re-evaluation. I will check labs as below.

## 2023-06-16 ENCOUNTER — Ambulatory Visit: Payer: Medicare Other | Admitting: Internal Medicine

## 2023-06-17 DIAGNOSIS — Z961 Presence of intraocular lens: Secondary | ICD-10-CM | POA: Diagnosis not present

## 2023-06-17 DIAGNOSIS — H538 Other visual disturbances: Secondary | ICD-10-CM | POA: Diagnosis not present

## 2023-06-17 DIAGNOSIS — H04123 Dry eye syndrome of bilateral lacrimal glands: Secondary | ICD-10-CM | POA: Diagnosis not present

## 2023-06-17 DIAGNOSIS — H26493 Other secondary cataract, bilateral: Secondary | ICD-10-CM | POA: Diagnosis not present

## 2023-06-17 DIAGNOSIS — E119 Type 2 diabetes mellitus without complications: Secondary | ICD-10-CM | POA: Diagnosis not present

## 2023-06-17 LAB — HM DIABETES EYE EXAM

## 2023-07-12 ENCOUNTER — Encounter: Payer: Self-pay | Admitting: Internal Medicine

## 2023-07-20 ENCOUNTER — Telehealth: Payer: Medicare Other | Admitting: Internal Medicine

## 2023-07-20 ENCOUNTER — Encounter: Payer: Self-pay | Admitting: Internal Medicine

## 2023-07-20 DIAGNOSIS — E785 Hyperlipidemia, unspecified: Secondary | ICD-10-CM | POA: Diagnosis not present

## 2023-07-20 DIAGNOSIS — F5101 Primary insomnia: Secondary | ICD-10-CM | POA: Diagnosis not present

## 2023-07-20 DIAGNOSIS — E1169 Type 2 diabetes mellitus with other specified complication: Secondary | ICD-10-CM | POA: Diagnosis not present

## 2023-07-20 DIAGNOSIS — F419 Anxiety disorder, unspecified: Secondary | ICD-10-CM

## 2023-07-20 MED ORDER — CLONAZEPAM 0.5 MG PO TABS: ORAL_TABLET | ORAL | 1 refills | Status: DC

## 2023-07-20 MED ORDER — DAPAGLIFLOZIN PROPANEDIOL 10 MG PO TABS: 10.0000 mg | ORAL_TABLET | Freq: Every day | ORAL | 1 refills | Status: DC

## 2023-07-20 NOTE — Progress Notes (Signed)
Virtual Visit via Video   This visit type was conducted due to national recommendations for restrictions regarding the COVID-19 Pandemic (e.g. social distancing) in an effort to limit this patient's exposure and mitigate transmission in our community.  Due to her co-morbid illnesses, this patient is at least at moderate risk for complications without adequate follow up.  This format is felt to be most appropriate for this patient at this time.  All issues noted in this document were discussed and addressed.  A limited physical exam was performed with this format.    This visit type was conducted due to national recommendations for restrictions regarding the COVID-19 Pandemic (e.g. social distancing) in an effort to limit this patient's exposure and mitigate transmission in our community.  Patients identity confirmed using two different identifiers.  This format is felt to be most appropriate for this patient at this time.  All issues noted in this document were discussed and addressed.  No physical exam was performed (except for noted visual exam findings with Video Visits).    Date:  07/24/2023   ID:  Anne Woodard, DOB November 09, 1955, MRN [id number]  Patient Location:  Home Office   Provider location:   Office    Chief Complaint:  "I have anxiety f/u"  History of Present Illness:    CHEROKEE BRUNICK is a 67 y.o. female who presents via video conferencing for a telehealth visit today.    The patient does not have symptoms concerning for COVID-19 infection (fever, chills, cough, or new shortness of breath).   She presents today for f/u anxiety. She presents virtually, per her request. The dose of escitalopram was increased to 20mg  at her last visit. She states this has eased her symptoms. She would like to stay on current dose. She has not experienced any ill effects from the dose increase.      Past Medical History:  Diagnosis Date   Abnormal EKG 01/14/2018   Arthritis    Right hip    Depression    Diabetes mellitus type 2 in obese 01/14/2018   Motion sickness    Boats   Multilevel degenerative disc disease    Pure hypercholesterolemia 01/14/2018   Vertigo    Past Surgical History:  Procedure Laterality Date   CATARACT EXTRACTION W/PHACO Right 04/21/2021   Procedure: CATARACT EXTRACTION PHACO AND INTRAOCULAR LENS PLACEMENT (IOC) RIGHT DIABETIC;  Surgeon: Nevada Crane, MD;  Location: [facility];  Service: Ophthalmology;  Laterality: Right;  1.85 00:18.0   CATARACT EXTRACTION W/PHACO Left 05/05/2021   Procedure: CATARACT EXTRACTION PHACO AND INTRAOCULAR LENS PLACEMENT (IOC) LEFT DIABETIC 2.31 00:21.0;  Surgeon: Nevada Crane, MD;  Location: [facility];  Service: Ophthalmology;  Laterality: Left;   PARTIAL HYSTERECTOMY  2000   cervical cancer     Current Meds  Medication Sig   clonazePAM (KLONOPIN) 0.5 MG tablet One tab po every day prn     Allergies:   Patient has no known allergies.   Social History   Tobacco Use   Smoking status: Never   Smokeless tobacco: Never   Tobacco comments:    Not applicable.   Vaping Use   Vaping status: Never Used  Substance Use Topics   Alcohol use: No   Drug use: Not Currently     Family Hx: The patient's family history includes Alzheimer's disease in her father; Anxiety disorder in her son; Colon cancer in her mother; Depression in her son; Endometrial cancer in her mother; Hypertension  in her mother and son; Multiple sclerosis in her son. There is no history of Breast cancer.  ROS:   Please see the history of present illness.    Review of Systems  Constitutional: Negative.   Respiratory: Negative.    Cardiovascular: Negative.   Gastrointestinal: Negative.   Neurological: Negative.   Psychiatric/Behavioral: Negative.      All other systems reviewed and are negative.   Labs/Other Tests and Data Reviewed:    Recent Labs: 02/01/2023: Hemoglobin 15.2; Platelets 233; TSH  1.340 06/08/2023: ALT 48; BUN 11; Creatinine, Ser 0.64; Potassium 4.3; Sodium 138   Recent Lipid Panel Lab Results  Component Value Date/Time   CHOL 148 02/01/2023 12:09 PM   TRIG 117 02/01/2023 12:09 PM   HDL 48 02/01/2023 12:09 PM   CHOLHDL 3.1 02/01/2023 12:09 PM   LDLCALC 79 02/01/2023 12:09 PM    Wt Readings from Last 3 Encounters:  06/08/23 195 lb 3.2 oz (88.5 kg)  02/01/23 190 lb 12.8 oz (86.5 kg)  08/05/22 180 lb (81.6 kg)     Exam:    Vital Signs:  There were no vitals taken for this visit.    Physical Exam Vitals and nursing note reviewed.  Constitutional:      Appearance: Normal appearance.  HENT:     Head: Normocephalic and atraumatic.  Eyes:     Extraocular Movements: Extraocular movements intact.  Pulmonary:     Effort: Pulmonary effort is normal.  Musculoskeletal:     Cervical back: Normal range of motion.  Neurological:     Mental Status: She is alert and oriented to person, place, and time.  Psychiatric:        Mood and Affect: Affect normal.     ASSESSMENT & PLAN:    Anxiety Assessment & Plan: Chronic, continue with escitalopram 20mg  daily. She was also given rx clonazepam to use nightly prn.    Dyslipidemia associated with type 2 diabetes mellitus (HCC) Assessment & Plan: She agrees to retry [place]. I will send rx to the pharmacy. Encouraged to stay well hydrated. May consider M-F dosing and to skip weekends to possibly decrease side effects. Importance of dietary compliance to decrease sx was also discussed with the patient. She agrees to rto in 4 weeks to check renal function.   Orders: -     BMP8+EGFR; Future  Primary insomnia Assessment & Plan: Chronic, she was given refill of clonazepam to use nightly prn.    Other orders -     clonazePAM; One tab po every day prn  Dispense: 30 tablet; Refill: 1 -     Dapagliflozin Propanediol; Take 1 tablet (10 mg total) by mouth daily.  Dispense: 90 tablet; Refill: 1     COVID-19  Education: The signs and symptoms of COVID-19 were discussed with the patient and how to seek care for testing (follow up with PCP or arrange E-visit).  The importance of social distancing was discussed today.  Patient Risk:   After full review of this patients clinical status, I feel that they are at least moderate risk at this time.  Time:   Today, I have spent 13 minutes/ seconds with the patient with telehealth technology discussing above diagnoses.     Medication Adjustments/Labs and Tests Ordered: Current medicines are reviewed at length with the patient today.  Concerns regarding medicines are outlined above.   Tests Ordered: Orders Placed This Encounter  Procedures   BMP8+EGFR    Medication Changes: Meds ordered this encounter  Medications  clonazePAM (KLONOPIN) 0.5 MG tablet    Sig: One tab po every day prn    Dispense:  30 tablet    Refill:  1   dapagliflozin propanediol (FARXIGA) 10 MG TABS tablet    Sig: Take 1 tablet (10 mg total) by mouth daily.    Dispense:  90 tablet    Refill:  1    Disposition:  Follow up prn  Signed, Gwynneth Aliment, MD

## 2023-07-21 ENCOUNTER — Other Ambulatory Visit: Payer: Self-pay

## 2023-07-22 ENCOUNTER — Other Ambulatory Visit: Payer: Self-pay

## 2023-07-24 NOTE — Assessment & Plan Note (Signed)
She agrees to [employer]. I will send rx to the pharmacy. Encouraged to stay well hydrated. May consider M-F dosing and to skip weekends to possibly decrease side effects. Importance of dietary compliance to decrease sx was also discussed with the patient. She agrees to rto in 4 weeks to check renal function.

## 2023-07-24 NOTE — Assessment & Plan Note (Signed)
Chronic, continue with escitalopram 20mg  daily. She was also given rx clonazepam to use nightly prn.

## 2023-07-24 NOTE — Assessment & Plan Note (Signed)
Chronic, she was given refill of clonazepam to use nightly prn.

## 2023-08-09 ENCOUNTER — Ambulatory Visit
Admission: RE | Admit: 2023-08-09 | Discharge: 2023-08-09 | Disposition: A | Discharge status: Home / Self Care | Payer: Medicare Other | Source: Ambulatory Visit | Attending: Internal Medicine | Admitting: Internal Medicine

## 2023-08-09 DIAGNOSIS — Z1231 Encounter for screening mammogram for malignant neoplasm of breast: Secondary | ICD-10-CM

## 2023-08-09 DIAGNOSIS — E2839 Other primary ovarian failure: Secondary | ICD-10-CM | POA: Diagnosis present

## 2023-08-09 DIAGNOSIS — Z780 Asymptomatic menopausal state: Secondary | ICD-10-CM | POA: Diagnosis not present

## 2023-08-09 DIAGNOSIS — M8589 Other specified disorders of bone density and structure, multiple sites: Secondary | ICD-10-CM | POA: Diagnosis not present

## 2023-08-12 ENCOUNTER — Other Ambulatory Visit (HOSPITAL_COMMUNITY)
Admission: RE | Admit: 2023-08-12 | Discharge: 2023-08-12 | Disposition: A | Discharge status: Home / Self Care | Payer: Medicare Other | Source: Ambulatory Visit | Attending: Certified Nurse Midwife | Admitting: Certified Nurse Midwife

## 2023-08-12 ENCOUNTER — Encounter: Payer: Self-pay | Admitting: Certified Nurse Midwife

## 2023-08-12 ENCOUNTER — Ambulatory Visit: Payer: Medicare Other | Admitting: Certified Nurse Midwife

## 2023-08-12 ENCOUNTER — Other Ambulatory Visit: Payer: Medicare Other

## 2023-08-12 VITALS — BP 132/81 | HR 84 | Ht 63.0 in | Wt 193.4 lb

## 2023-08-12 DIAGNOSIS — B3731 Acute candidiasis of vulva and vagina: Secondary | ICD-10-CM

## 2023-08-12 DIAGNOSIS — E1169 Type 2 diabetes mellitus with other specified complication: Secondary | ICD-10-CM

## 2023-08-12 DIAGNOSIS — N898 Other specified noninflammatory disorders of vagina: Secondary | ICD-10-CM

## 2023-08-12 DIAGNOSIS — M1611 Unilateral primary osteoarthritis, right hip: Secondary | ICD-10-CM | POA: Diagnosis not present

## 2023-08-12 DIAGNOSIS — S7001XA Contusion of right hip, initial encounter: Secondary | ICD-10-CM | POA: Diagnosis not present

## 2023-08-12 DIAGNOSIS — M7542 Impingement syndrome of left shoulder: Secondary | ICD-10-CM | POA: Diagnosis not present

## 2023-08-12 DIAGNOSIS — E785 Hyperlipidemia, unspecified: Secondary | ICD-10-CM | POA: Diagnosis not present

## 2023-08-12 MED ORDER — FLUCONAZOLE 150 MG PO TABS: 150.0000 mg | ORAL_TABLET | ORAL | 0 refills | Status: AC

## 2023-08-12 NOTE — Progress Notes (Signed)
[name], MD   Chief Complaint  Patient presents with   Vaginitis    HPI:      Anne Woodard is a 67 y.o. [phone] whose LMP was No LMP recorded. Patient has had a hysterectomy., presents today for vaginal irritation. Started 5 days ago, has not tried over the counter treatment. She is taking [place] and yeast infection is a possible side effect. This happened last month and she tried topical OTC treatment which made the irritation worse. She stopped the [place] and the rash resolved within 3-4 days. She used cold compresses for comfort. She is also wondering if menopausal atrophic changes are contributing. She is not sexually active.    Patient Active Problem List   Diagnosis Date Noted   Primary insomnia 06/15/2023   Class 2 severe obesity due to excess calories with serious comorbidity and body mass index (BMI) of 36.0 to 36.9 in adult [facility]) 06/08/2023   Class 1 obesity due to excess calories with serious comorbidity and body mass index (BMI) of 32.0 to 32.9 in adult 03/16/2022   Anxiety 10/01/2018   Abscess of left breast 10/01/2018   Abnormal EKG 01/14/2018   Pure hypercholesterolemia 01/14/2018   Dyslipidemia associated with type 2 diabetes mellitus (HCC) 01/14/2018    Past Surgical History:  Procedure Laterality Date   CATARACT EXTRACTION W/PHACO Right 04/21/2021   Procedure: CATARACT EXTRACTION PHACO AND INTRAOCULAR LENS PLACEMENT (IOC) RIGHT DIABETIC;  Surgeon: Nevada Crane, MD;  Location: [facility];  Service: Ophthalmology;  Laterality: Right;  1.85 00:18.0   CATARACT EXTRACTION W/PHACO Left 05/05/2021   Procedure: CATARACT EXTRACTION PHACO AND INTRAOCULAR LENS PLACEMENT (IOC) LEFT DIABETIC 2.31 00:21.0;  Surgeon: Nevada Crane, MD;  Location: [facility];  Service: Ophthalmology;  Laterality: Left;   PARTIAL HYSTERECTOMY  2000   cervical cancer    Family History  Problem Relation Age of Onset   Colon cancer Mother    Endometrial  cancer Mother    Hypertension Mother    Alzheimer's disease Father    Multiple sclerosis Son    Hypertension Son    Depression Son    Anxiety disorder Son    Breast cancer Neg Hx     Social History   Socioeconomic History   Marital status: Married    Spouse name: Not on file   Number of children: Not on file   Years of education: Not on file   Highest education level: Not on file  Occupational History   Not on file  Tobacco Use   Smoking status: Never   Smokeless tobacco: Never   Tobacco comments:    Not applicable.   Vaping Use   Vaping status: Never Used  Substance and Sexual Activity   Alcohol use: No   Drug use: Not Currently   Sexual activity: Not Currently    Birth control/protection: None  Other Topics Concern   Not on file  Social History Narrative   Not on file   Social Determinants of Health   Financial Resource Strain: Low Risk  (08/05/2022)   Overall Financial Resource Strain (CARDIA)    Difficulty of Paying Living Expenses: Not hard at all  Food Insecurity: No Food Insecurity (08/05/2022)   Hunger Vital Sign    Worried About Running Out of Food in the Last Year: Never true    Ran Out of Food in the Last Year: Never true  Transportation Needs: No Transportation Needs (08/05/2022)   PRAPARE - Transportation  Lack of Transportation (Medical): No    Lack of Transportation (Non-Medical): No  Physical Activity: Insufficiently Active (08/05/2022)   Exercise Vital Sign    Days of Exercise per Week: 3 days    Minutes of Exercise per Session: 20 min  Stress: No Stress Concern Present (08/05/2022)   [employer] of Occupational Health - Occupational Stress Questionnaire    Feeling of Stress : Not at all  Social Connections: Not on file  Intimate Partner Violence: Not on file    Outpatient Medications Prior to Visit  Medication Sig Dispense Refill   clonazePAM (KLONOPIN) 0.5 MG tablet One tab po every day prn 30 tablet 1   dapagliflozin propanediol  (FARXIGA) 10 MG TABS tablet Take 1 tablet (10 mg total) by mouth daily. 90 tablet 1   doxylamine, Sleep, (UNISOM) 25 MG tablet Take 25 mg by mouth at bedtime as needed.     escitalopram (LEXAPRO) 20 MG tablet Take 1 tablet (20 mg total) by mouth daily. 90 tablet 1   glucose blood (ONETOUCH VERIO) test strip Use as directed to check blood sugars daily dx: e11.69 100 each 2   losartan (COZAAR) 25 MG tablet TAKE 1 TABLET BY MOUTH EVERY DAY 90 tablet 2   rosuvastatin (CRESTOR) 10 MG tablet TAKE 1 TABLET BY MOUTH EVERY DAY 90 tablet 1   eszopiclone 3 MG TABS Take 1 tablet (3 mg total) by mouth at bedtime as needed. Take immediately before bedtime 30 tablet 1   Multiple Vitamin (MULTIVITAMIN) tablet Take 1 tablet by mouth daily.     No facility-administered medications prior to visit.      ROS:  Review of Systems  Constitutional: Negative.   Respiratory: Negative.    Cardiovascular: Negative.   Genitourinary:  Positive for vaginal discharge and vaginal pain.     OBJECTIVE:   Vitals:  BP 132/81   Pulse 84   Ht 5\' 3"  (1.6 m)   Wt 193 lb 6.4 oz (87.7 kg)   BMI 34.26 kg/m   Physical Exam Constitutional:      Appearance: Normal appearance.  Cardiovascular:     Rate and Rhythm: Normal rate.  Pulmonary:     Effort: Pulmonary effort is normal.  Genitourinary:    Labia:        Right: Tenderness present.        Left: Tenderness present.      Vagina: Vaginal discharge and erythema present.     Comments: Small amount thick white discharge at vaginal introitus, irritation present. Aptima swab collected. Skin:    General: Skin is warm and dry.  Neurological:     General: No focal deficit present.     Mental Status: She is alert and oriented to person, place, and time.  Psychiatric:        Mood and Affect: Mood normal.        Behavior: Behavior normal.     Results: No results found for this or any previous visit (from the past 24 hour(s)).   Assessment/Plan: Vaginal  irritation - Plan: Cervicovaginal ancillary only  Vulvovaginal candidiasis  Start Diflucan 150mg  po every 72h for 2 doses. Discussed possible use of local estrogen once infection resolved, however do not recommend starting now.  Meds ordered this encounter  Medications   fluconazole (DIFLUCAN) 150 MG tablet    Sig: Take 1 tablet (150 mg total) by mouth every 3 (three) days for 2 doses.    Dispense:  2 tablet    Refill:  0  Order Specific Question:   Supervising Provider    Answer:   Hildred Laser [[id number]]     Dominica Severin, CNM 08/12/2023 11:24 AM

## 2023-08-13 LAB — BMP8+EGFR
BUN/Creatinine Ratio: 22 (ref 12–28)
BUN: 14 mg/dL (ref 8–27)
CO2: 20 mmol/L (ref 20–29)
Calcium: 9.3 mg/dL (ref 8.7–10.3)
Chloride: 102 mmol/L (ref 96–106)
Creatinine, Ser: 0.64 mg/dL (ref 0.57–1.00)
Glucose: 232 mg/dL — ABNORMAL HIGH (ref 70–99)
Potassium: 4.1 mmol/L (ref 3.5–5.2)
Sodium: 136 mmol/L (ref 134–144)
eGFR: 96 mL/min/{1.73_m2} (ref >59)

## 2023-08-13 LAB — CERVICOVAGINAL ANCILLARY ONLY
Bacterial Vaginitis (gardnerella): NEGATIVE
Candida Glabrata: NEGATIVE
Candida Vaginitis: POSITIVE — AB
Comment: NEGATIVE
Comment: NEGATIVE
Comment: NEGATIVE

## 2023-08-26 ENCOUNTER — Other Ambulatory Visit: Payer: Self-pay

## 2023-08-26 ENCOUNTER — Ambulatory Visit: Payer: Self-pay | Admitting: Internal Medicine

## 2023-08-26 ENCOUNTER — Ambulatory Visit: Payer: Medicare Other

## 2023-08-26 DIAGNOSIS — Z0000 Encounter for general adult medical examination without abnormal findings: Secondary | ICD-10-CM | POA: Diagnosis not present

## 2023-08-26 NOTE — Progress Notes (Signed)
Subjective:   Anne Woodard is a 67 y.o. female who presents for Medicare Annual (Subsequent) preventive examination.  Visit Complete: Virtual I connected with  MICHIKO MANDLER on 08/26/23 by a audio enabled telemedicine application and verified that I am speaking with the correct person using two identifiers.  Patient Location: Home  Provider Location: Office/Clinic  I discussed the limitations of evaluation and management by telemedicine. The patient expressed understanding and agreed to proceed.  Vital Signs: Because this visit was a virtual/telehealth visit, some criteria may be missing or patient reported. Any vitals not documented were not able to be obtained and vitals that have been documented are patient reported.    Cardiac Risk Factors include: advanced age (>32men, >34 women);diabetes mellitus;dyslipidemia     Objective:    Today's Vitals   There is no height or weight on file to calculate BMI.     08/26/2023    9:29 AM 08/05/2022    9:38 AM 07/17/2021    2:36 PM 05/05/2021    6:30 AM 04/21/2021    9:55 AM 07/11/2020   11:10 AM 06/27/2019    3:41 PM  Advanced Directives  Does Patient Have a Medical Advance Directive? No No No No No No No  Would patient like information on creating a medical advance directive?  No - Patient declined  No - Patient declined Yes (MAU/Ambulatory/Procedural Areas - Information given) Yes (MAU/Ambulatory/Procedural Areas - Information given)     Current Medications (verified) Outpatient Encounter Medications as of 08/26/2023  Medication Sig   clonazePAM (KLONOPIN) 0.5 MG tablet One tab po every day prn   dapagliflozin propanediol (FARXIGA) 10 MG TABS tablet Take 1 tablet (10 mg total) by mouth daily.   doxylamine, Sleep, (UNISOM) 25 MG tablet Take 25 mg by mouth at bedtime as needed.   escitalopram (LEXAPRO) 20 MG tablet Take 1 tablet (20 mg total) by mouth daily.   glucose blood (ONETOUCH VERIO) test strip Use as directed to check  blood sugars daily dx: e11.69   losartan (COZAAR) 25 MG tablet TAKE 1 TABLET BY MOUTH EVERY DAY   rosuvastatin (CRESTOR) 10 MG tablet TAKE 1 TABLET BY MOUTH EVERY DAY   eszopiclone 3 MG TABS Take 1 tablet (3 mg total) by mouth at bedtime as needed. Take immediately before bedtime   Multiple Vitamin (MULTIVITAMIN) tablet Take 1 tablet by mouth daily.   No facility-administered encounter medications on file as of 08/26/2023.    Allergies (verified) Patient has no known allergies.   History: Past Medical History:  Diagnosis Date   Abnormal EKG 01/14/2018   Arthritis    Right hip   Depression    Diabetes mellitus type 2 in obese 01/14/2018   Motion sickness    Boats   Multilevel degenerative disc disease    Pure hypercholesterolemia 01/14/2018   Vertigo    Past Surgical History:  Procedure Laterality Date   CATARACT EXTRACTION W/PHACO Right 04/21/2021   Procedure: CATARACT EXTRACTION PHACO AND INTRAOCULAR LENS PLACEMENT (IOC) RIGHT DIABETIC;  Surgeon: Nevada Crane, MD;  Location: [facility];  Service: Ophthalmology;  Laterality: Right;  1.85 00:18.0   CATARACT EXTRACTION W/PHACO Left 05/05/2021   Procedure: CATARACT EXTRACTION PHACO AND INTRAOCULAR LENS PLACEMENT (IOC) LEFT DIABETIC 2.31 00:21.0;  Surgeon: Nevada Crane, MD;  Location: [facility];  Service: Ophthalmology;  Laterality: Left;   PARTIAL HYSTERECTOMY  2000   cervical cancer   Family History  Problem Relation Age of Onset   Colon  cancer Mother    Endometrial cancer Mother    Hypertension Mother    Alzheimer's disease Father    Multiple sclerosis Son    Hypertension Son    Depression Son    Anxiety disorder Son    Breast cancer Neg Hx    Social History   Socioeconomic History   Marital status: Married    Spouse name: Not on file   Number of children: Not on file   Years of education: Not on file   Highest education level: Not on file  Occupational History   Not on file   Tobacco Use   Smoking status: Never   Smokeless tobacco: Never   Tobacco comments:    Not applicable.   Vaping Use   Vaping status: Never Used  Substance and Sexual Activity   Alcohol use: No   Drug use: Not Currently   Sexual activity: Not Currently    Birth control/protection: None  Other Topics Concern   Not on file  Social History Narrative   Not on file   Social Determinants of Health   Financial Resource Strain: Low Risk  (08/26/2023)   Overall Financial Resource Strain (CARDIA)    Difficulty of Paying Living Expenses: Not hard at all  Food Insecurity: No Food Insecurity (08/26/2023)   Hunger Vital Sign    Worried About Running Out of Food in the Last Year: Never true    Ran Out of Food in the Last Year: Never true  Transportation Needs: No Transportation Needs (08/26/2023)   PRAPARE - Administrator, Civil Service (Medical): No    Lack of Transportation (Non-Medical): No  Physical Activity: Inactive (08/26/2023)   Exercise Vital Sign    Days of Exercise per Week: 0 days    Minutes of Exercise per Session: 0 min  Stress: No Stress Concern Present (08/26/2023)   [employer] of Occupational Health - Occupational Stress Questionnaire    Feeling of Stress : Not at all  Social Connections: Unknown (08/26/2023)   Social Connection and Isolation Panel [NHANES]    Frequency of Communication with Friends and Family: More than three times a week    Frequency of Social Gatherings with Friends and Family: Three times a week    Attends Religious Services: Never    Active Member of Clubs or Organizations: No    Attends Banker Meetings: Never    Marital Status: Not on file    Tobacco Counseling Counseling given: Not Answered Tobacco comments: Not applicable.    Clinical Intake:  Pre-visit preparation completed: Yes  Pain : No/denies pain     Nutritional Risks: None Diabetes: Yes CBG done?: No Did pt. bring in CBG monitor from  home?: No  How often do you need to have someone help you when you read instructions, pamphlets, or other written materials from your doctor or pharmacy?: 1 - Never  Interpreter Needed?: No  Information entered by :: NAllen LPN   Activities of Daily Living    08/26/2023    9:26 AM  In your present state of health, do you have any difficulty performing the following activities:  Hearing? 0  Vision? 0  Difficulty concentrating or making decisions? 0  Walking or climbing stairs? 0  Dressing or bathing? 0  Doing errands, shopping? 0  Preparing Food and eating ? N  Using the Toilet? N  In the past six months, have you accidently leaked urine? Y  Comment taking farxiga  Do you have  problems with loss of bowel control? N  Managing your Medications? N  Managing your Finances? N  Housekeeping or managing your Housekeeping? N    Patient Care Team: [name], MD as PCP - General (Internal Medicine)  Indicate any recent Medical Services you may have received from other than [facility] providers in the past year (date may be approximate).     Assessment:   This is a routine wellness examination for [place].  Hearing/Vision screen Hearing Screening - Comments:: Denies hearing issues Vision Screening - Comments:: Regular eye exams, [facility]   Goals Addressed             This Visit's Progress    Patient Stated       08/26/2023, wants to lose weight       Depression Screen    08/26/2023    9:31 AM 06/08/2023    2:03 PM 02/01/2023   11:15 AM 08/05/2022    9:39 AM 08/05/2022    9:02 AM 07/17/2021    2:37 PM 07/11/2020   11:12 AM  PHQ 2/9 Scores  PHQ - 2 Score 0 0 0 0 0 0 0  PHQ- 9 Score 0 0 0        Fall Risk    08/26/2023    9:30 AM 06/08/2023    2:03 PM 02/01/2023   11:15 AM 08/05/2022    9:39 AM 08/05/2022    9:01 AM  Fall Risk   Falls in the past year? 1 0 0 1 1  Comment tripped   tripped   Number falls in past yr: 0 0 0 0 0  Injury with Fall? 0 0 0 0 0   Risk for fall due to : Medication side effect No Fall Risks No Fall Risks Medication side effect No Fall Risks  Follow up Falls prevention discussed;Falls evaluation completed Falls evaluation completed Falls evaluation completed Falls evaluation completed;Education provided;Falls prevention discussed Falls evaluation completed    MEDICARE RISK AT HOME: Medicare Risk at Home Any stairs in or around the home?: Yes If so, are there any without handrails?: No Home free of loose throw rugs in walkways, pet beds, electrical cords, etc?: Yes Adequate lighting in your home to reduce risk of falls?: Yes Life alert?: No Use of a cane, walker or w/c?: No Grab bars in the bathroom?: No Shower chair or bench in shower?: Yes Elevated toilet seat or a handicapped toilet?: No  TIMED UP AND GO:  Was the test performed?  No    Cognitive Function:        08/26/2023    9:32 AM 08/05/2022    9:40 AM 07/17/2021    2:38 PM 07/11/2020   11:13 AM 06/27/2019    3:57 PM  6CIT Screen  What Year? 0 points 0 points 0 points 0 points 0 points  What month? 0 points 0 points 0 points 0 points 0 points  What time? 0 points 0 points 0 points 0 points 0 points  Count back from 20 0 points 0 points 0 points 0 points 0 points  Months in reverse 0 points 0 points 0 points 0 points 0 points  Repeat phrase 2 points 0 points 0 points 0 points 0 points  Total Score 2 points 0 points 0 points 0 points 0 points    Immunizations Immunization History  Administered Date(s) Administered   Fluad Quad(high Dose 65+) 07/11/2020, 08/06/2021, 08/05/2022   Fluad Trivalent(High Dose 65+) 06/08/2023   Influenza Inj Mdck  Quad Pf 07/29/2018   Influenza, High Dose Seasonal PF 06/27/2019   Influenza,inj,Quad PF,6+ Mos 07/15/2017   Influenza-Unspecified 06/19/2018   PFIZER Comirnaty(Gray Top)Covid-19 Tri-Sucrose Vaccine 11/01/2020   PFIZER(Purple Top)SARS-COV-2 Vaccination 12/29/2019, 01/24/2020, 11/01/2020   PNEUMOCOCCAL  CONJUGATE-20 11/10/2021   Pneumococcal Polysaccharide-23 11/09/2015   Tdap 04/29/2017    TDAP status: Up to date  Flu Vaccine status: Up to date  Pneumococcal vaccine status: Up to date  Covid-19 vaccine status: Information provided on how to obtain vaccines.   Qualifies for Shingles Vaccine? Yes   Zostavax completed No   Shingrix Completed?: No.    Education has been provided regarding the importance of this vaccine. Patient has been advised to call insurance company to determine out of pocket expense if they have not yet received this vaccine. Advised may also receive vaccine at local pharmacy or Health Dept. Verbalized acceptance and understanding.  Screening Tests Health Maintenance  Topic Date Due   Zoster Vaccines- Shingrix (1 of 2) 09/07/2023 (Originally 04/19/1975)   COVID-19 Vaccine (5 - 2023-24 season) 09/11/2023 (Originally 06/06/2023)   HEMOGLOBIN A1C  12/06/2023   Diabetic kidney evaluation - Urine ACR  02/01/2024   FOOT EXAM  02/01/2024   OPHTHALMOLOGY EXAM  06/16/2024   Diabetic kidney evaluation - eGFR measurement  08/11/2024   Medicare Annual Wellness (AWV)  08/25/2024   MAMMOGRAM  08/08/2025   Colonoscopy  02/26/2027   DTaP/Tdap/Td (2 - Td or Tdap) 04/30/2027   Pneumonia Vaccine 43+ Years old  Completed   INFLUENZA VACCINE  Completed   DEXA SCAN  Completed   Hepatitis C Screening  Completed   HPV VACCINES  Aged Out    Health Maintenance  There are no preventive care reminders to display for this patient.   Colorectal cancer screening: Type of screening: Colonoscopy. Completed 02/25/2022. Repeat every 5 years  Mammogram status: Completed 08/09/2023. Repeat every year  Bone Density status: Completed 05/13/2020.   Lung Cancer Screening: (Low Dose CT Chest recommended if Age 1-80 years, 20 pack-year currently smoking OR have quit w/in 15years.) does not qualify.   Lung Cancer Screening Referral: no  Additional Screening:  Hepatitis C Screening: does  qualify; Completed 10/15/2018  Vision Screening: Recommended annual ophthalmology exams for early detection of glaucoma and other disorders of the eye. Is the patient up to date with their annual eye exam?  Yes  Who is the provider or what is the name of the office in which the patient attends annual eye exams? [facility] If pt is not established with a provider, would they like to be referred to a provider to establish care? No .   Dental Screening: Recommended annual dental exams for proper oral hygiene  Diabetic Foot Exam: Diabetic Foot Exam: Completed 02/01/2023  Community Resource Referral / Chronic Care Management: CRR required this visit?  No   CCM required this visit?  No     Plan:     I have personally reviewed and noted the following in the patient's chart:   Medical and social history Use of alcohol, tobacco or illicit drugs  Current medications and supplements including opioid prescriptions. Patient is not currently taking opioid prescriptions. Functional ability and status Nutritional status Physical activity Advanced directives List of other physicians Hospitalizations, surgeries, and ER visits in previous 12 months Vitals Screenings to include cognitive, depression, and falls Referrals and appointments  In addition, I have reviewed and discussed with patient certain preventive protocols, quality metrics, and best practice recommendations. A written personalized care  plan for preventive services as well as general preventive health recommendations were provided to patient.     Barb Merino, LPN   [id number]   After Visit Summary: (MyChart) Due to this being a telephonic visit, the after visit summary with patients personalized plan was offered to patient via MyChart   Nurse Notes: none

## 2023-08-26 NOTE — Patient Instructions (Signed)
Anne Woodard , Thank you for taking time to come for your Medicare Wellness Visit. I appreciate your ongoing commitment to your health goals. Please review the following plan we discussed and let me know if I can assist you in the future.   Referrals/Orders/Follow-Ups/Clinician Recommendations: none  This is a list of the screening recommended for you and due dates:  Health Maintenance  Topic Date Due   Zoster (Shingles) Vaccine (1 of 2) 09/07/2023*   COVID-19 Vaccine (5 - 2023-24 season) 09/11/2023*   Hemoglobin A1C  12/06/2023   Yearly kidney health urinalysis for diabetes  02/01/2024   Complete foot exam   02/01/2024   Eye exam for diabetics  06/16/2024   Yearly kidney function blood test for diabetes  08/11/2024   Medicare Annual Wellness Visit  08/25/2024   Mammogram  08/08/2025   Colon Cancer Screening  02/26/2027   DTaP/Tdap/Td vaccine (2 - Td or Tdap) 04/30/2027   Pneumonia Vaccine  Completed   Flu Shot  Completed   DEXA scan (bone density measurement)  Completed   Hepatitis C Screening  Completed   HPV Vaccine  Aged Out  *Topic was postponed. The date shown is not the original due date.    Advanced directives: (ACP Link)Information on Advanced Care Planning can be found at [facility] Advance Health Care Directives Advance Health Care Directives ([email or web address]   Next Medicare Annual Wellness Visit scheduled for next year: No, office will schedule appointment  Insert Preventive Care attachment Insert FALL PREVENTION attachment if needed

## 2023-09-15 ENCOUNTER — Other Ambulatory Visit: Payer: Self-pay | Admitting: Internal Medicine

## 2023-09-15 ENCOUNTER — Encounter: Payer: Self-pay | Admitting: Internal Medicine

## 2023-09-15 MED ORDER — FLUCONAZOLE 150 MG PO TABS: ORAL_TABLET | ORAL | 0 refills | Status: DC

## 2023-10-07 ENCOUNTER — Ambulatory Visit (INDEPENDENT_AMBULATORY_CARE_PROVIDER_SITE_OTHER): Payer: Medicare Other | Admitting: Internal Medicine

## 2023-10-07 ENCOUNTER — Encounter: Payer: Self-pay | Admitting: Internal Medicine

## 2023-10-07 VITALS — BP 124/82 | HR 83 | Temp 98.5°F | Ht 63.0 in | Wt 196.8 lb

## 2023-10-07 DIAGNOSIS — N952 Postmenopausal atrophic vaginitis: Secondary | ICD-10-CM

## 2023-10-07 DIAGNOSIS — Z6834 Body mass index (BMI) 34.0-34.9, adult: Secondary | ICD-10-CM

## 2023-10-07 DIAGNOSIS — E1169 Type 2 diabetes mellitus with other specified complication: Secondary | ICD-10-CM

## 2023-10-07 DIAGNOSIS — E785 Hyperlipidemia, unspecified: Secondary | ICD-10-CM | POA: Diagnosis not present

## 2023-10-07 DIAGNOSIS — F419 Anxiety disorder, unspecified: Secondary | ICD-10-CM

## 2023-10-07 DIAGNOSIS — E66811 Obesity, class 1: Secondary | ICD-10-CM

## 2023-10-07 DIAGNOSIS — E6609 Other obesity due to excess calories: Secondary | ICD-10-CM

## 2023-10-07 DIAGNOSIS — F5101 Primary insomnia: Secondary | ICD-10-CM

## 2023-10-07 DIAGNOSIS — Z8541 Personal history of malignant neoplasm of cervix uteri: Secondary | ICD-10-CM

## 2023-10-07 MED ORDER — ESCITALOPRAM OXALATE 20 MG PO TABS: 20.0000 mg | ORAL_TABLET | Freq: Every day | ORAL | 1 refills | Status: DC

## 2023-10-07 NOTE — Progress Notes (Signed)
 I,Victoria T Emmitt, CMA,acting as a neurosurgeon for [name], MD.,have documented all relevant documentation on the behalf of [name], MD,as directed by  [name], MD while in the presence of [name], MD.  Subjective:  Patient ID: Anne Woodard , female    DOB: [birth year] , 68 y.o.   MRN: [id number]  Chief Complaint  Patient presents with   Diabetes    HPI  She presents today for Rybelsus 3MG  follow up. She previously had been given Farxiga  which caused reoccurring yeast infections. She reports compliance with Rybelsus. She does like this medication better. She has no specific questions or concerns.   Diabetes She presents for her follow-up diabetic visit. She has type 2 diabetes mellitus. Her disease course has been stable. There are no hypoglycemic associated symptoms. Pertinent negatives for hypoglycemia include no headaches. Pertinent negatives for diabetes include no chest pain, no fatigue, no polydipsia, no polyphagia, no polyuria and no weakness. There are no hypoglycemic complications. Symptoms are worsening. Risk factors for coronary artery disease include diabetes mellitus, hypertension, obesity, sedentary lifestyle and post-menopausal. Her breakfast blood glucose range is generally 110-130 mg/dl. Eye exam is not current.     Past Medical History:  Diagnosis Date   Abnormal EKG 01/14/2018   Arthritis    Right hip   Depression    Diabetes mellitus type 2 in obese 01/14/2018   Motion sickness    Boats   Multilevel degenerative disc disease    Pure hypercholesterolemia 01/14/2018   Vertigo      Family History  Problem Relation Age of Onset   Colon cancer Mother    Endometrial cancer Mother    Hypertension Mother    Alzheimer's disease Father    Multiple sclerosis Son    Hypertension Son    Depression Son    Anxiety disorder Son    Breast cancer Neg Hx      Current Outpatient Medications:    clonazePAM  (KLONOPIN ) 0.5 MG tablet, One tab  po every day prn, Disp: 30 tablet, Rfl: 1   doxylamine, Sleep, (UNISOM) 25 MG tablet, Take 25 mg by mouth at bedtime as needed., Disp: , Rfl:    escitalopram  (LEXAPRO ) 20 MG tablet, Take 1 tablet (20 mg total) by mouth daily., Disp: 90 tablet, Rfl: 1   fluconazole  (DIFLUCAN ) 150 MG tablet, One tab po today, repeat in 48 hours if needed, Disp: 2 tablet, Rfl: 0   glucose blood (ONETOUCH VERIO) test strip, Use as directed to check blood sugars daily dx: e11.69, Disp: 100 each, Rfl: 2   losartan  (COZAAR ) 25 MG tablet, TAKE 1 TABLET BY MOUTH EVERY DAY, Disp: 90 tablet, Rfl: 2   rosuvastatin  (CRESTOR ) 10 MG tablet, TAKE 1 TABLET BY MOUTH EVERY DAY, Disp: 90 tablet, Rfl: 1   No Known Allergies   Review of Systems  Constitutional: Negative.  Negative for fatigue.  Respiratory: Negative.    Cardiovascular: Negative.  Negative for chest pain.  Endocrine: Negative for polydipsia, polyphagia and polyuria.  Neurological: Negative.  Negative for weakness and headaches.  Psychiatric/Behavioral: Negative.       Today's Vitals   10/07/23 1523  BP: 124/82  Pulse: 83  Temp: 98.5 F (36.9 C)  SpO2: 98%  Weight: 196 lb 12.8 oz (89.3 kg)  Height: 5' 3 (1.6 m)   Body mass index is 34.86 kg/m.  Wt Readings from Last 3 Encounters:  10/07/23 196 lb 12.8 oz (89.3 kg)  08/12/23 193 lb 6.4  oz (87.7 kg)  06/08/23 195 lb 3.2 oz (88.5 kg)     Objective:  Physical Exam Vitals and nursing note reviewed.  Constitutional:      Appearance: Normal appearance. She is obese.  HENT:     Head: Normocephalic and atraumatic.  Eyes:     Extraocular Movements: Extraocular movements intact.  Cardiovascular:     Rate and Rhythm: Normal rate and regular rhythm.     Heart sounds: Normal heart sounds.  Pulmonary:     Effort: Pulmonary effort is normal.     Breath sounds: Normal breath sounds.  Skin:    General: Skin is warm.  Neurological:     General: No focal deficit present.     Mental Status: She is alert.   Psychiatric:        Mood and Affect: Mood normal.        Behavior: Behavior normal.         Assessment And Plan:  Dyslipidemia associated with type 2 diabetes mellitus (HCC) Assessment & Plan: Chronic, unfortunately she did not tolerate SGLT2i therapy. She has done well with Rybelsus 3mg  daily. I plan to titrate her to 7mg  eventually. She was given samples of Rybelsus 3mg  to take once daily. If A1c is above goal, I will increase dose to 2 tabs daily and send rx 7mg  Rybelsus to start PA process.    Orders: -     Hemoglobin A1c  Atrophic vaginitis Assessment & Plan: She does report h/o cervical cancer s/p hysterectomy. I do not think cervical CA is hormone dependent; however, she is hesitant to start vaginal estradiol. She is advised that I will speak with my GYN colleagues.  She has no h/o breast cancer, DVT/PE and liver disease. She spoke with her GYN who was unsure about how to approach this.    Anxiety Assessment & Plan: Chronic, she has done well with escitalopram  20mg  daily. She has noticed decreased use of clonazepam .  I will send refills as requested.    Class 1 obesity due to excess calories with serious comorbidity and body mass index (BMI) of 34.0 to 34.9 in adult Assessment & Plan: She is encouraged to strive for BMI less than 30 to decrease cardiac risk. Advised to aim for at least 150 minutes of exercise per week.    History of cervical cancer  Other orders -     Escitalopram  Oxalate; Take 1 tablet (20 mg total) by mouth daily.  Dispense: 90 tablet; Refill: 1  She is encouraged to strive for BMI less than 30 to decrease cardiac risk. Advised to aim for at least 150 minutes of exercise per week.    Return if symptoms worsen or fail to improve.  Patient was given opportunity to ask questions. Patient verbalized understanding of the plan and was able to repeat key elements of the plan. All questions were answered to their satisfaction.    I, [name], MD,  have reviewed all documentation for this visit. The documentation on 10/07/23 for the exam, diagnosis, procedures, and orders are all accurate and complete.   IF YOU HAVE BEEN REFERRED TO A SPECIALIST, IT MAY TAKE 1-2 WEEKS TO SCHEDULE/PROCESS THE REFERRAL. IF YOU HAVE NOT HEARD FROM US /SPECIALIST IN TWO WEEKS, PLEASE GIVE US  A CALL AT [phone].   THE PATIENT IS ENCOURAGED TO PRACTICE SOCIAL DISTANCING DUE TO THE COVID-19 PANDEMIC.

## 2023-10-07 NOTE — Assessment & Plan Note (Addendum)
 She does report h/o cervical cancer s/p hysterectomy. I do not think cervical CA is hormone dependent; however, she is hesitant to start vaginal estradiol. She is advised that I will speak with my GYN colleagues.  She has no h/o breast cancer, DVT/PE and liver disease. She spoke with her GYN who was unsure about how to approach this.

## 2023-10-07 NOTE — Patient Instructions (Signed)

## 2023-10-08 LAB — HEMOGLOBIN A1C
Est. average glucose Bld gHb Est-mCnc: 200 mg/dL
Hgb A1c MFr Bld: 8.6 % — ABNORMAL HIGH (ref 4.8–5.6)

## 2023-10-10 NOTE — Assessment & Plan Note (Signed)
 Chronic, unfortunately she did not tolerate SGLT2i therapy. She has done well with Rybelsus 3mg  daily. I plan to titrate her to 7mg  eventually. She was given samples of Rybelsus 3mg  to take once daily. If A1c is above goal, I will increase dose to 2 tabs daily and send rx 7mg  Rybelsus to start PA process.

## 2023-10-10 NOTE — Assessment & Plan Note (Signed)
 Chronic, she has done well with escitalopram 20mg  daily. She has noticed decreased use of clonazepam.  I will send refills as requested.

## 2023-10-10 NOTE — Assessment & Plan Note (Signed)
 She is encouraged to strive for BMI less than 30 to decrease cardiac risk. Advised to aim for at least 150 minutes of exercise per week.

## 2023-10-13 DIAGNOSIS — M25512 Pain in left shoulder: Secondary | ICD-10-CM | POA: Diagnosis not present

## 2023-10-13 DIAGNOSIS — M25551 Pain in right hip: Secondary | ICD-10-CM | POA: Diagnosis not present

## 2023-10-25 ENCOUNTER — Encounter: Payer: Self-pay | Admitting: Internal Medicine

## 2023-11-11 ENCOUNTER — Other Ambulatory Visit: Payer: Self-pay | Admitting: Internal Medicine

## 2023-12-10 ENCOUNTER — Other Ambulatory Visit: Payer: Self-pay | Admitting: Internal Medicine

## 2023-12-23 ENCOUNTER — Telehealth: Payer: Self-pay

## 2023-12-23 NOTE — Telephone Encounter (Signed)
 Patient was identified as falling into the [place] Measure - Diabetes.   Patient was: Appointment scheduled for lab or office visit for A1c.

## 2024-01-04 ENCOUNTER — Other Ambulatory Visit: Payer: Self-pay | Admitting: Internal Medicine

## 2024-01-05 ENCOUNTER — Other Ambulatory Visit: Payer: Self-pay | Admitting: Internal Medicine

## 2024-01-05 MED ORDER — CLONAZEPAM 0.5 MG PO TABS: 0.5000 mg | ORAL_TABLET | Freq: Every day | ORAL | 1 refills | Status: DC | PRN

## 2024-02-03 ENCOUNTER — Ambulatory Visit (INDEPENDENT_AMBULATORY_CARE_PROVIDER_SITE_OTHER): Payer: Self-pay | Admitting: Internal Medicine

## 2024-02-03 ENCOUNTER — Encounter: Payer: Self-pay | Admitting: Internal Medicine

## 2024-02-03 VITALS — BP 120/80 | HR 60 | Temp 98.4°F | Ht 63.0 in | Wt 193.2 lb

## 2024-02-03 DIAGNOSIS — E6609 Other obesity due to excess calories: Secondary | ICD-10-CM

## 2024-02-03 DIAGNOSIS — E1169 Type 2 diabetes mellitus with other specified complication: Secondary | ICD-10-CM

## 2024-02-03 DIAGNOSIS — E785 Hyperlipidemia, unspecified: Secondary | ICD-10-CM

## 2024-02-03 DIAGNOSIS — K141 Geographic tongue: Secondary | ICD-10-CM | POA: Diagnosis not present

## 2024-02-03 DIAGNOSIS — F5101 Primary insomnia: Secondary | ICD-10-CM | POA: Diagnosis not present

## 2024-02-03 DIAGNOSIS — Z6834 Body mass index (BMI) 34.0-34.9, adult: Secondary | ICD-10-CM

## 2024-02-03 DIAGNOSIS — Z0000 Encounter for general adult medical examination without abnormal findings: Secondary | ICD-10-CM | POA: Insufficient documentation

## 2024-02-03 DIAGNOSIS — F419 Anxiety disorder, unspecified: Secondary | ICD-10-CM

## 2024-02-03 DIAGNOSIS — E66811 Obesity, class 1: Secondary | ICD-10-CM

## 2024-02-03 LAB — POCT URINALYSIS DIPSTICK
Bilirubin, UA: NEGATIVE
Blood, UA: NEGATIVE
Glucose, UA: NEGATIVE
Ketones, UA: NEGATIVE
Leukocytes, UA: NEGATIVE
Nitrite, UA: POSITIVE
Protein, UA: NEGATIVE
Spec Grav, UA: 1.025 (ref 1.010–1.025)
Urobilinogen, UA: 0.2 U/dL
pH, UA: 5.5 (ref 5.0–8.0)

## 2024-02-03 NOTE — Progress Notes (Unsigned)
 I,Anne Woodard, CMA,acting as a Neurosurgeon for Anne Dung, MD.,have documented all relevant documentation on the behalf of Anne Dung, MD,as directed by  Anne Dung, MD while in the presence of Anne Dung, MD.  Subjective:    Patient ID: Anne Woodard , female    DOB: 11/17/1955 , 68 y.o.   MRN: [id number]  Chief Complaint  Patient presents with   Annual Exam    Patient presents today for annual exam. She reports compliance with medications. Denies headache, chest pain & sob.  She reports while being on the Rybelsus she experienced nausea & severe vomiting. She stopped the medication a month ago.    Diabetes    HPI Discussed the use of AI scribe software for clinical note transcription with the patient, who gave verbal consent to proceed.  History of Present Illness Anne Woodard is a 68 year old female with type 2 diabetes who presents for an annual physical exam.  She has a history of type 2 diabetes and has been managing her condition with various medications. She was previously on Rybelsus but discontinued it two months ago due to severe nausea and vomiting after consuming a meal that included fish and chips. No one else who ate with her got sick, and she usually does not consume oily foods. She has not been on any diabetes medication for the past two weeks due to side effects from previous medications, including diarrhea from metformin.  She is currently taking rosuvastatin  10 mg daily, losartan  25 mg daily, escitalopram  20 mg daily, and Klonopin  as needed. She also uses an over-the-counter sleeping pill, Unison. She is not taking any medication for her diabetes at the moment.  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  She recently retired and is adjusting to her new lifestyle. She stays active by reading and managing her stock portfolio. She has a history of  arthritis, which she refers to as 'Mr. Aden Honor and acknowledges the importance of exercise and stretching to manage her symptoms.     Diabetes She presents for her follow-up diabetic visit. She has type 2 diabetes mellitus. Her disease course has been stable. There are no hypoglycemic associated symptoms. Pertinent negatives for hypoglycemia include no headaches. Pertinent negatives for diabetes include no chest pain, no fatigue, no polydipsia, no polyphagia, no polyuria and no weakness. There are no hypoglycemic complications. Symptoms are worsening. Risk factors for coronary artery disease include diabetes mellitus, hypertension, obesity, sedentary lifestyle and post-menopausal. Her breakfast blood glucose range is generally 110-130 mg/dl. Eye exam is not current.     Past Medical History:  Diagnosis Date   Abnormal EKG 01/14/2018   Arthritis    Right hip   Depression    Diabetes mellitus type 2 in obese 01/14/2018   Motion sickness    Boats   Multilevel degenerative disc disease    Pure hypercholesterolemia 01/14/2018   Vertigo      Family History  Problem Relation Age of Onset   Colon cancer Mother    Endometrial cancer Mother    Hypertension Mother    Alzheimer's disease Father    Multiple sclerosis Son    Hypertension Son    Depression Son    Anxiety disorder Son    Breast cancer Neg Hx      Current Outpatient Medications:    clonazePAM  (KLONOPIN ) 0.5 MG tablet, Take  1 tablet (0.5 mg total) by mouth daily as needed., Disp: 30 tablet, Rfl: 1   doxylamine, Sleep, (UNISOM) 25 MG tablet, Take 25 mg by mouth at bedtime as needed., Disp: , Rfl:    escitalopram  (LEXAPRO ) 20 MG tablet, Take 1 tablet (20 mg total) by mouth daily., Disp: 90 tablet, Rfl: 1   glucose blood (ONETOUCH VERIO) test strip, Use as directed to check blood sugars daily dx: e11.69, Disp: 100 each, Rfl: 2   losartan  (COZAAR ) 25 MG tablet, TAKE 1 TABLET BY MOUTH EVERY DAY, Disp: 90 tablet, Rfl: 2    rosuvastatin  (CRESTOR ) 10 MG tablet, TAKE 1 TABLET BY MOUTH EVERY DAY, Disp: 90 tablet, Rfl: 2   fluconazole  (DIFLUCAN ) 150 MG tablet, One tab po today, repeat in 48 hours if needed (Patient not taking: Reported on 02/03/2024), Disp: 2 tablet, Rfl: 0   No Known Allergies    The patient states she uses {contraceptive methods:5051} for birth control. No LMP recorded. Patient has had a hysterectomy.. {Dysmenorrhea-menorrhagia:21918}. Negative for: breast discharge, breast lump(s), breast pain and breast self exam. Associated symptoms include abnormal vaginal bleeding. Pertinent negatives include abnormal bleeding (hematology), anxiety, decreased libido, depression, difficulty falling sleep, dyspareunia, history of infertility, nocturia, sexual dysfunction, sleep disturbances, urinary incontinence, urinary urgency, vaginal discharge and vaginal itching. Diet regular.The patient states her exercise level is    . The patient's tobacco use is:  Social History   Tobacco Use  Smoking Status Never  Smokeless Tobacco Never  Tobacco Comments   Not applicable.   . She has been exposed to passive smoke. The patient's alcohol use is:  Social History   Substance and Sexual Activity  Alcohol Use No  . Additional information: Last pap ***, next one scheduled for ***.    Review of Systems  Constitutional: Negative.  Negative for fatigue.  HENT: Negative.    Eyes: Negative.   Respiratory: Negative.    Cardiovascular: Negative.  Negative for chest pain.  Gastrointestinal: Negative.   Endocrine: Negative.  Negative for polydipsia, polyphagia and polyuria.  Genitourinary: Negative.   Musculoskeletal: Negative.   Skin: Negative.   Allergic/Immunologic: Negative.   Neurological: Negative.  Negative for weakness and headaches.  Hematological: Negative.   Psychiatric/Behavioral: Negative.       Today's Vitals   02/03/24 1051  BP: 120/80  Pulse: 60  Temp: 98.4 F (36.9 C)  SpO2: 98%  Weight: 193 lb  3.2 oz (87.6 kg)  Height: 5\' 3"  (1.6 m)   Body mass index is 34.22 kg/m.  Wt Readings from Last 3 Encounters:  02/03/24 193 lb 3.2 oz (87.6 kg)  10/07/23 196 lb 12.8 oz (89.3 kg)  08/12/23 193 lb 6.4 oz (87.7 kg)    BP Readings from Last 3 Encounters:  02/03/24 120/80  10/07/23 124/82  08/12/23 132/81     Objective:  Physical Exam Vitals and nursing note reviewed.  Constitutional:      Appearance: Normal appearance. She is obese.  HENT:     Head: Normocephalic and atraumatic.      Comments: Patchy tongue, also with white patch on tongue    Right Ear: Tympanic membrane, ear canal and external ear normal.     Left Ear: Tympanic membrane, ear canal and external ear normal.     Mouth/Throat:     Mouth: Mucous membranes are moist.     Pharynx: Oropharynx is clear.  Eyes:     Extraocular Movements: Extraocular movements intact.     Conjunctiva/sclera: Conjunctivae normal.  Pupils: Pupils are equal, round, and reactive to light.  Cardiovascular:     Rate and Rhythm: Normal rate and regular rhythm.     Pulses: Normal pulses.          Dorsalis pedis pulses are 2+ on the right side and 2+ on the left side.     Heart sounds: Normal heart sounds.  Pulmonary:     Effort: Pulmonary effort is normal.     Breath sounds: Normal breath sounds.  Chest:  Breasts:    Tanner Score is 5.     Right: Normal.     Left: Normal.  Abdominal:     General: Bowel sounds are normal.     Palpations: Abdomen is soft.  Genitourinary:    Comments: deferred Musculoskeletal:        General: Normal range of motion.     Cervical back: Normal range of motion and neck supple.  Feet:     Right foot:     Protective Sensation: 5 sites tested.  5 sites sensed.     Toenail Condition: Right toenails are normal.     Left foot:     Protective Sensation: 5 sites tested.  5 sites sensed.     Toenail Condition: Left toenails are normal.  Skin:    General: Skin is warm and dry.  Neurological:      General: No focal deficit present.     Mental Status: She is alert and oriented to person, place, and time.  Psychiatric:        Mood and Affect: Mood normal.        Behavior: Behavior normal.         Assessment And Plan:   Assessment & Plan Type 2 diabetes mellitus Type 2 diabetes mellitus with previous intolerance to Rybelsus due to gastrointestinal side effects. Alternative treatments discussed, including Janumet, with concerns about metformin's impact on renal function. She is willing to retry Rybelsus with a modified dosing schedule to manage side effects and cost. - Start Rybelsus on a modified schedule: Monday, Wednesday, Friday. - Provide samples of Rybelsus if available. - Recheck renal function in one month if metformin-containing medication is started. - Monitor blood glucose levels and adjust dosing schedule as needed.  Osteoarthritis Osteoarthritis with leg tightness. Emphasis on exercise and stretching to manage symptoms. - Encourage exercise and stretching, including tai chi, for arthritis management.  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  Wellness Visit Routine wellness visit conducted with medication review and lifestyle modifications discussed. - Encourage exercise and stretching, including tai chi, for balance and arthritis management.     Return for 1 year HM, 4 MONTH DM F/U.Aaron Aas Patient was given opportunity to ask questions. Patient verbalized understanding of the plan and was able to repeat key elements of the plan. All questions were answered to their satisfaction.   I, Anne Dung, MD, have reviewed all documentation for this visit. The documentation on 02/03/24 for the exam, diagnosis, procedures, and orders are all accurate and complete.

## 2024-02-03 NOTE — Patient Instructions (Signed)

## 2024-02-04 LAB — LIPID PANEL
Chol/HDL Ratio: 3.4 ratio (ref 0.0–4.4)
Cholesterol, Total: 149 mg/dL (ref 100–199)
HDL: 44 mg/dL (ref >39)
LDL Chol Calc (NIH): 83 mg/dL (ref 0–99)
Triglycerides: 123 mg/dL (ref 0–149)
VLDL Cholesterol Cal: 22 mg/dL (ref 5–40)

## 2024-02-04 LAB — CMP14+EGFR
ALT: 58 IU/L — ABNORMAL HIGH (ref 0–32)
AST: 86 IU/L — ABNORMAL HIGH (ref 0–40)
Albumin: 4.4 g/dL (ref 3.9–4.9)
Alkaline Phosphatase: 118 IU/L (ref 44–121)
BUN/Creatinine Ratio: 14 (ref 12–28)
BUN: 10 mg/dL (ref 8–27)
Bilirubin Total: 0.5 mg/dL (ref 0.0–1.2)
CO2: 20 mmol/L (ref 20–29)
Calcium: 9.7 mg/dL (ref 8.7–10.3)
Chloride: 103 mmol/L (ref 96–106)
Creatinine, Ser: 0.74 mg/dL (ref 0.57–1.00)
Globulin, Total: 2.8 g/dL (ref 1.5–4.5)
Glucose: 173 mg/dL — ABNORMAL HIGH (ref 70–99)
Potassium: 4.3 mmol/L (ref 3.5–5.2)
Sodium: 139 mmol/L (ref 134–144)
Total Protein: 7.2 g/dL (ref 6.0–8.5)
eGFR: 88 mL/min/{1.73_m2} (ref >59)

## 2024-02-04 LAB — CBC
Hematocrit: 46.2 % (ref 34.0–46.6)
Hemoglobin: 14.4 g/dL (ref 11.1–15.9)
MCH: 29.7 pg (ref 26.6–33.0)
MCHC: 31.2 g/dL — ABNORMAL LOW (ref 31.5–35.7)
MCV: 95 fL (ref 79–97)
Platelets: 241 10*3/uL (ref 150–450)
RBC: 4.85 x10E6/uL (ref 3.77–5.28)
RDW: 12.3 % (ref 11.7–15.4)
WBC: 6.4 10*3/uL (ref 3.4–10.8)

## 2024-02-04 LAB — HEMOGLOBIN A1C
Est. average glucose Bld gHb Est-mCnc: 183 mg/dL
Hgb A1c MFr Bld: 8 % — ABNORMAL HIGH (ref 4.8–5.6)

## 2024-02-04 LAB — MICROALBUMIN / CREATININE URINE RATIO
Creatinine, Urine: 144.6 mg/dL
Microalb/Creat Ratio: 13 mg/g{creat} (ref 0–29)
Microalbumin, Urine: 18.2 ug/mL

## 2024-02-05 ENCOUNTER — Encounter: Payer: Self-pay | Admitting: Internal Medicine

## 2024-02-06 NOTE — Assessment & Plan Note (Signed)
 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  May benefit from Magic mouthwash if she develops sensitivity.

## 2024-02-06 NOTE — Assessment & Plan Note (Signed)

## 2024-02-06 NOTE — Assessment & Plan Note (Addendum)
 Chronic, she was given refill of clonazepam  to use nightly prn. PDMP reviewed. Encouraged to develop a bedtime routine.

## 2024-02-06 NOTE — Assessment & Plan Note (Signed)
 She is encouraged to strive for BMI less than 30 to decrease cardiac risk. Advised to aim for at least 150 minutes of exercise per week.

## 2024-02-06 NOTE — Assessment & Plan Note (Addendum)
 Chronic, diabetic foot exam was performed. Type 2 diabetes mellitus with previous intolerance to Rybelsus due to gastrointestinal side effects. Alternative treatments discussed, including Janumet, with concerns about metformin's impact on renal function. She is willing to retry Rybelsus with a modified dosing schedule to manage side effects and cost. - Start Rybelsus on a modified schedule: Monday, Wednesday, Friday. - Provide samples of Rybelsus if available. - Recheck renal function in one month if metformin-containing medication is started. - Monitor blood glucose levels and adjust dosing schedule as needed. - EKG performed, NSR w/ incomplete right bundle branch block and right axis -possible right ventricular hypertrophy -consider pulmonary disease or posterior fasicular block.  I DISCUSSED WITH THE PATIENT AT LENGTH REGARDING THE GOALS OF GLYCEMIC CONTROL AND POSSIBLE LONG-TERM COMPLICATIONS.  I  ALSO STRESSED THE IMPORTANCE OF COMPLIANCE WITH HOME GLUCOSE MONITORING, DIETARY RESTRICTIONS INCLUDING AVOIDANCE OF SUGARY DRINKS/PROCESSED FOODS,  ALONG WITH REGULAR EXERCISE.  I  ALSO STRESSED THE IMPORTANCE OF ANNUAL EYE EXAMS, SELF FOOT CARE AND COMPLIANCE WITH OFFICE VISITS.

## 2024-02-07 ENCOUNTER — Other Ambulatory Visit: Payer: Self-pay | Admitting: Internal Medicine

## 2024-02-07 DIAGNOSIS — R7989 Other specified abnormal findings of blood chemistry: Secondary | ICD-10-CM

## 2024-02-16 ENCOUNTER — Ambulatory Visit
Admission: RE | Admit: 2024-02-16 | Discharge: 2024-02-16 | Disposition: A | Discharge status: Home / Self Care | Source: Ambulatory Visit | Attending: Internal Medicine | Admitting: Internal Medicine

## 2024-02-16 DIAGNOSIS — K760 Fatty (change of) liver, not elsewhere classified: Secondary | ICD-10-CM | POA: Diagnosis not present

## 2024-02-16 DIAGNOSIS — R748 Abnormal levels of other serum enzymes: Secondary | ICD-10-CM | POA: Diagnosis not present

## 2024-02-16 DIAGNOSIS — R7989 Other specified abnormal findings of blood chemistry: Secondary | ICD-10-CM | POA: Diagnosis not present

## 2024-02-17 ENCOUNTER — Ambulatory Visit: Payer: Self-pay | Admitting: Internal Medicine

## 2024-03-29 ENCOUNTER — Telehealth

## 2024-03-29 DIAGNOSIS — B86 Scabies: Secondary | ICD-10-CM | POA: Diagnosis not present

## 2024-03-30 MED ORDER — PERMETHRIN 5 % EX CREA: 1.0000 | TOPICAL_CREAM | Freq: Once | CUTANEOUS | 0 refills | Status: AC

## 2024-03-30 NOTE — Progress Notes (Signed)
 I have spent 5 minutes in review of e-visit questionnaire, review and updating patient chart, medical decision making and response to patient.   Piedad Climes, PA-C

## 2024-03-30 NOTE — Progress Notes (Signed)
E-Visit for Scabies  We are sorry that you are not feeling well. Here is how we plan to help!  Based on what you shared with me it looks like you have scabies.  Scabies is an infection caused by very tiny mites that burrow into the skin.  They cause severe itching. Though children are most commonly infected, anyone can get scabies.  Scabies mites can pass from person to person through physical close contact.  They can also be passed through shared clothing, towels, and bedding.  Scabies infection is not usually dangerous, but it is uncomfortable.  Because it is so contagious, scabies should be treated immediately to keep the infection from spreading.  If you have children in day care, please have any exposed children examined by their pediatrician. .   I have prescribed Permethrin topical cream 5% thoroughly massage cream from head to soles of feet; leave on for 8 to 14 hours before removing (shower or bath). May repeat if living mites are observed 14 days after first treatment; one application is generally curative.    HOME CARE:  You should treat all members in your household whether they show symptoms or not. Wash towels, clothes, bed linens, cloth toys, hats, and other personal items in hot water and dry on high heat. Vacuum floors and furniture and throw away the bag afterwards. Keep your child home from day care or school until the morning after treatment for scabies. Notify your child's day care or school so that other children can be checked and treated.  GET HELP RIGHT AWAY IF:  The infected person has a fever, red streaks, pain, or swelling of the skin. Sores get worse or don't heal. New rashes appear or itching continues for more than 2 weeks after treatment.  MAKE SURE YOU:  Understand these instructions. Will watch your condition. Will get help right away if you are not doing well or get worse.  Thank you for choosing an e-visit.  Your e-visit answers were reviewed by a  board certified advanced clinical practitioner to complete your personal care plan. Depending upon the condition, your plan could have included both over the counter or prescription medications.  Please review your pharmacy choice. Make sure the pharmacy is open so you can pick up prescription now. If there is a problem, you may contact your provider through MyChart messaging and have the prescription routed to another pharmacy.  Your safety is important to us. If you have drug allergies check your prescription carefully.   For the next 24 hours you can use MyChart to ask questions about today's visit, request a non-urgent call back, or ask for a work or school excuse. You will get an email in the next two days asking about your experience. I hope that your e-visit has been valuable and will speed your recovery.  

## 2024-05-09 ENCOUNTER — Ambulatory Visit: Admitting: Internal Medicine

## 2024-05-30 ENCOUNTER — Encounter: Payer: Self-pay | Admitting: Internal Medicine

## 2024-05-30 ENCOUNTER — Ambulatory Visit (INDEPENDENT_AMBULATORY_CARE_PROVIDER_SITE_OTHER): Admitting: Internal Medicine

## 2024-05-30 VITALS — BP 120/70 | HR 72 | Temp 98.8°F | Ht 63.0 in | Wt 196.0 lb

## 2024-05-30 DIAGNOSIS — E6609 Other obesity due to excess calories: Secondary | ICD-10-CM

## 2024-05-30 DIAGNOSIS — R7989 Other specified abnormal findings of blood chemistry: Secondary | ICD-10-CM

## 2024-05-30 DIAGNOSIS — Z6834 Body mass index (BMI) 34.0-34.9, adult: Secondary | ICD-10-CM

## 2024-05-30 DIAGNOSIS — E785 Hyperlipidemia, unspecified: Secondary | ICD-10-CM | POA: Diagnosis not present

## 2024-05-30 DIAGNOSIS — K760 Fatty (change of) liver, not elsewhere classified: Secondary | ICD-10-CM | POA: Diagnosis not present

## 2024-05-30 DIAGNOSIS — F5101 Primary insomnia: Secondary | ICD-10-CM

## 2024-05-30 DIAGNOSIS — R051 Acute cough: Secondary | ICD-10-CM | POA: Insufficient documentation

## 2024-05-30 DIAGNOSIS — M542 Cervicalgia: Secondary | ICD-10-CM | POA: Insufficient documentation

## 2024-05-30 DIAGNOSIS — E1169 Type 2 diabetes mellitus with other specified complication: Secondary | ICD-10-CM | POA: Diagnosis not present

## 2024-05-30 DIAGNOSIS — E66811 Obesity, class 1: Secondary | ICD-10-CM

## 2024-05-30 DIAGNOSIS — F419 Anxiety disorder, unspecified: Secondary | ICD-10-CM

## 2024-05-30 LAB — POC COVID19 BINAXNOW: SARS Coronavirus 2 Ag: NEGATIVE

## 2024-05-30 MED ORDER — CLONAZEPAM 0.5 MG PO TABS: 0.5000 mg | ORAL_TABLET | Freq: Every day | ORAL | 1 refills | Status: DC | PRN

## 2024-05-30 MED ORDER — DAYVIGO 5 MG PO TABS
ORAL_TABLET | ORAL | 1 refills | Status: DC
Start: 2024-05-30 — End: 2024-08-08

## 2024-05-30 NOTE — Assessment & Plan Note (Signed)
 She is encouraged to strive for BMI less than 30 to decrease cardiac risk. Advised to aim for at least 150 minutes of exercise per week.

## 2024-05-30 NOTE — Assessment & Plan Note (Signed)
 Type 2 diabetes mellitus with recent blood glucose levels 150-170 mg/dL. Previous intolerance to Farxiga  and Rybelsus. Sensitive stomach limits medication options. Jardiance discussed as potential alternative with emphasis on hydration. Alternative options include DPP-4 inhibitors and combination therapies if Jardiance is not tolerated. - Initiate Jardiance 10 mg Monday through Friday, skipping weekends. - Provide samples of Jardiance. - Schedule blood test in four weeks to assess kidney function if Jardiance is tolerated. - Consider adding metformin if Jardiance is tolerated but not sufficient.
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
## 2024-05-30 NOTE — Assessment & Plan Note (Signed)
 Suspected COVID-19 infection with symptoms of cough, stuffy nose, and diarrhea since last Wednesday or Thursday. - Perform COVID-19 test. - COVID test is negative - She declines trying Zyrtec for relief, prefers to use natural remedies

## 2024-05-30 NOTE — Patient Instructions (Addendum)
 IB-gard capsules  Diabetes: Healthy Eating for Adults When you have diabetes, also called diabetes mellitus, it's important to have healthy eating habits. Your blood sugar (glucose) levels are greatly affected by what you eat and drink. You need to eat healthy foods in the right amounts, at about the same times each day. Doing this can help you: Manage your blood sugar. Lower your risk of heart disease. Improve your blood pressure. Reach or stay at a healthy weight. What can affect my meal plan? Every person with diabetes is different. And each person has different needs for a meal plan. Your health care provider may suggest that you work with an expert in healthy eating called a dietitian. They can help you make a meal plan that's best for you. How do carbohydrates affect me? Carbohydrates, also called carbs, affect your blood sugar level more than any other type of food. Eating carbs raises the amount of sugar in your blood. It's important to know how many carbs you can safely have in each meal. This is different for every person. Your dietitian can help you calculate how many carbs you should have at each meal and for each snack. How does alcohol affect me? Alcohol can cause a decrease in blood sugar (hypoglycemia), especially if you use insulin or take certain diabetes medicines by mouth. Hypoglycemia can be a life-threatening condition. Symptoms of hypoglycemia are similar to those of having too much alcohol. They include confusion, being sleepy, and feeling dizzy. Do not drink alcohol if: Your provider tells you not to drink. You're pregnant, may be pregnant, or plan to become pregnant. What are tips for following this plan? Reading food labels Start by checking the serving size on the Nutrition Facts label of packaged foods and drinks. The number of calories and the amount of carbs, fats, and other nutrients listed on the label are based on one serving of the item. Many items contain more  than one serving per package. Check the total grams (g) of carbs in one serving. Check the number of grams of saturated fats and trans fats in one serving. Choose foods that have a low amount or none of these fats. Check the number of milligrams (mg) of salt (sodium) in one serving. Most people should limit their total sodium intake to less than 2,300 mg per day. Always check the nutrition information of foods labeled as low-fat or nonfat. These foods may be higher in added sugar or refined carbs and should be avoided. Talk to your dietitian to identify your daily goals for nutrients listed on the label. Shopping Avoid buying canned, pre-made, or processed foods. These foods tend to be high in fat, sodium, and added sugar. Shop around the outside edge of the grocery store. This is where you'll most often find fresh fruits and vegetables, bulk grains, fresh meats, and fresh dairy products. Cooking Use low-heat cooking methods, such as baking, instead of high-heat methods like deep frying. Cook using healthy oils, such as olive, canola, or sunflower oil. Avoid cooking with butter, cream, or high-fat meats. Meal planning  Eat meals and snacks regularly. Try to eat them at the same times every day. Avoid going too long without eating. Eat foods that are high in fiber, such as fresh fruits, vegetables, beans, and whole grains. Eat 4-6 oz (112-168 g) of lean protein each day, such as lean meat, chicken, fish, eggs, or tofu. One ounce (oz) (28 g) of lean protein is equal to: 1 oz (28 g) of meat,  chicken, or fish. 1 egg.  cup (62 g) of tofu. Eat some foods each day that contain healthy fats, such as avocado, nuts, seeds, and fish. What foods should I eat? Fruits Berries. Apples. Oranges. Peaches. Apricots. Plums. Grapes. Mangoes. Papayas. Pomegranates. Kiwi. Cherries. Vegetables Leafy greens, including lettuce, spinach, kale, chard, collard greens, mustard greens, and cabbage. Beets.  Cauliflower. Broccoli. Carrots. Green beans. Tomatoes. Peppers. Onions. Cucumbers. Brussels sprouts. Grains Whole grains, such as whole-wheat or whole-grain bread, crackers, tortillas, cereal, and pasta. Unsweetened oatmeal. Quinoa. Brown or wild rice. Meats and other proteins Seafood. Poultry without skin. Lean cuts of poultry and beef. Tofu. Nuts. Seeds. Dairy Low-fat or fat-free dairy products such as milk, yogurt, and cheese. The items listed above may not be all the foods and drinks you can have. Talk with a dietitian to learn more. What foods should I avoid? Fruits Fruits canned with syrup. Vegetables Canned vegetables. Frozen vegetables with butter or cream sauce. Grains Refined white flour and flour products such as bread, pasta, snack foods, and cereals. Avoid all processed foods. Meats and other proteins Fatty cuts of meat. Poultry with skin. Breaded or fried meats. Processed meat. Avoid saturated fats. Dairy Full-fat yogurt, cheese, or milk. Beverages Sweetened drinks, such as soda or iced tea. The items listed above may not be all the foods and drinks you should avoid. Talk with a dietitian to learn more. Where to find more information: To learn more, go to: Academy of Nutrition and Dietetics at DeathPrevention.it. Click Search and type diabetes. Find the link you need. Centers for Disease Control and Prevention at TonerPromos.no. Click Search and type diabetes. Find the link you need. American Diabetes Association: diabetes.org/food-nutrition [employer] of Diabetes and Digestive and Kidney Diseases: StageSync.si This information is not intended to replace advice given to you by your health care provider. Make sure you discuss any questions you have with your health care provider. Document Revised: 09/09/2023 Document Reviewed: 09/09/2023 Elsevier Patient Education  2025 [employer].

## 2024-05-30 NOTE — Assessment & Plan Note (Signed)
 Insomnia with previous use of Lexapro  and Unisom. Concerns about potential serotonin syndrome with current medication regimen. Previous trials of Lunesta  were ineffective or had undesirable side effects. Dayvigo  discussed as alternative sleep aid. - Prescribe Dayvigo  for sleep. - Advise against taking clonazepam  at night if using sleep medication.

## 2024-05-30 NOTE — Assessment & Plan Note (Signed)
 Currently managed with Lexapro . Recent adjustment to half dose due to potential interaction with Unisom and DayQuil. - Continue Lexapro  at half dose, 10mg  - PDMP reviewed - Refill sent for clonazepam 

## 2024-05-30 NOTE — Assessment & Plan Note (Signed)
 Nonalcoholic fatty liver disease with previous elevated liver enzymes. Further evaluation of liver fibrosis needed. - Most recent RUQ ultrasound reviewed - Order fib-4 test to assess liver fibrosis.

## 2024-05-31 ENCOUNTER — Ambulatory Visit: Payer: Self-pay | Admitting: Internal Medicine

## 2024-05-31 LAB — BASIC METABOLIC PANEL WITH GFR
BUN/Creatinine Ratio: 22 (ref 12–28)
BUN: 13 mg/dL (ref 8–27)
CO2: 19 mmol/L — ABNORMAL LOW (ref 20–29)
Calcium: 9.2 mg/dL (ref 8.7–10.3)
Chloride: 105 mmol/L (ref 96–106)
Creatinine, Ser: 0.58 mg/dL (ref 0.57–1.00)
Glucose: 172 mg/dL — ABNORMAL HIGH (ref 70–99)
Potassium: 3.8 mmol/L (ref 3.5–5.2)
Sodium: 138 mmol/L (ref 134–144)
eGFR: 97 mL/min/1.73 (ref >59)

## 2024-05-31 LAB — HEMOGLOBIN A1C
Est. average glucose Bld gHb Est-mCnc: 214 mg/dL
Hgb A1c MFr Bld: 9.1 % — ABNORMAL HIGH (ref 4.8–5.6)

## 2024-06-01 ENCOUNTER — Encounter: Payer: Self-pay | Admitting: Internal Medicine

## 2024-06-01 LAB — FIB-4 W/REFLEX TO ELF
ALT: 43 IU/L — ABNORMAL HIGH (ref 0–32)
AST: 43 IU/L — ABNORMAL HIGH (ref 0–40)
FIB-4 Index: 2.03 (ref 0.00–2.67)
Platelets: 226 x10E3/uL (ref 150–450)

## 2024-06-01 LAB — ENHANCED LIVER FIBROSIS (ELF): ELF(TM) Score: 9.8 — ABNORMAL HIGH (ref <9.80)

## 2024-06-09 ENCOUNTER — Other Ambulatory Visit: Payer: Self-pay | Admitting: Internal Medicine

## 2024-06-13 ENCOUNTER — Encounter: Payer: Self-pay | Admitting: Internal Medicine

## 2024-06-22 DIAGNOSIS — Z961 Presence of intraocular lens: Secondary | ICD-10-CM | POA: Diagnosis not present

## 2024-06-22 DIAGNOSIS — H04123 Dry eye syndrome of bilateral lacrimal glands: Secondary | ICD-10-CM | POA: Diagnosis not present

## 2024-06-22 DIAGNOSIS — H26493 Other secondary cataract, bilateral: Secondary | ICD-10-CM | POA: Diagnosis not present

## 2024-06-22 DIAGNOSIS — E119 Type 2 diabetes mellitus without complications: Secondary | ICD-10-CM | POA: Diagnosis not present

## 2024-06-22 LAB — HM DIABETES EYE EXAM

## 2024-07-09 ENCOUNTER — Encounter: Payer: Self-pay | Admitting: Internal Medicine

## 2024-07-10 ENCOUNTER — Other Ambulatory Visit: Payer: Self-pay

## 2024-07-10 MED ORDER — ESCITALOPRAM OXALATE 10 MG PO TABS: 10.0000 mg | ORAL_TABLET | Freq: Every day | ORAL | 1 refills | Status: AC

## 2024-07-30 ENCOUNTER — Other Ambulatory Visit: Payer: Self-pay | Admitting: Internal Medicine

## 2024-08-07 ENCOUNTER — Other Ambulatory Visit: Payer: Self-pay

## 2024-08-07 ENCOUNTER — Ambulatory Visit: Payer: Self-pay | Admitting: Internal Medicine

## 2024-08-07 DIAGNOSIS — E1169 Type 2 diabetes mellitus with other specified complication: Secondary | ICD-10-CM

## 2024-08-07 LAB — HEMOGLOBIN A1C
Est. average glucose Bld gHb Est-mCnc: 186 mg/dL
Hgb A1c MFr Bld: 8.1 % — ABNORMAL HIGH (ref 4.8–5.6)

## 2024-08-08 ENCOUNTER — Other Ambulatory Visit: Payer: Self-pay | Admitting: Internal Medicine

## 2024-08-08 ENCOUNTER — Encounter: Payer: Self-pay | Admitting: Internal Medicine

## 2024-08-08 MED ORDER — EMPAGLIFLOZIN 10 MG PO TABS: 10.0000 mg | ORAL_TABLET | Freq: Every day | ORAL | 3 refills | Status: AC

## 2024-08-11 ENCOUNTER — Ambulatory Visit: Payer: Self-pay

## 2024-08-11 NOTE — Progress Notes (Deleted)
 Subjective:   Anne Woodard is a 68 y.o. female who presents for a Medicare Annual Wellness Visit.  Allergies (verified) Patient has no known allergies.   History: Past Medical History:  Diagnosis Date   Abnormal EKG 01/14/2018   Arthritis    Right hip   Depression    Diabetes mellitus type 2 in obese 01/14/2018   Motion sickness    Boats   Multilevel degenerative disc disease    Pure hypercholesterolemia 01/14/2018   Vertigo    Past Surgical History:  Procedure Laterality Date   CATARACT EXTRACTION W/PHACO Right 04/21/2021   Procedure: CATARACT EXTRACTION PHACO AND INTRAOCULAR LENS PLACEMENT (IOC) RIGHT DIABETIC;  Surgeon: Myrna Adine Anes, MD;  Location: [facility];  Service: Ophthalmology;  Laterality: Right;  1.85 00:18.0   CATARACT EXTRACTION W/PHACO Left 05/05/2021   Procedure: CATARACT EXTRACTION PHACO AND INTRAOCULAR LENS PLACEMENT (IOC) LEFT DIABETIC 2.31 00:21.0;  Surgeon: Myrna Adine Anes, MD;  Location: [facility];  Service: Ophthalmology;  Laterality: Left;   PARTIAL HYSTERECTOMY  2000   cervical cancer   Family History  Problem Relation Age of Onset   Colon cancer Mother    Endometrial cancer Mother    Hypertension Mother    Alzheimer's disease Father    Multiple sclerosis Son    Hypertension Son    Depression Son    Anxiety disorder Son    Breast cancer Neg Hx    Social History   Occupational History   Not on file  Tobacco Use   Smoking status: Never   Smokeless tobacco: Never   Tobacco comments:    Not applicable.   Vaping Use   Vaping status: Never Used  Substance and Sexual Activity   Alcohol use: No   Drug use: Not Currently   Sexual activity: Not Currently    Birth control/protection: None   Tobacco Counseling Counseling given: Not Answered Tobacco comments: Not applicable.   SDOH Screenings   Food Insecurity: No Food Insecurity (05/29/2024)  Housing: Low Risk  (05/29/2024)  Transportation Needs: No  Transportation Needs (05/29/2024)  Utilities: Not At Risk (08/26/2023)  Alcohol Screen: Low Risk  (10/06/2023)  Depression (PHQ2-9): Low Risk  (05/30/2024)  Financial Resource Strain: Low Risk  (05/29/2024)  Physical Activity: Insufficiently Active (05/29/2024)  Social Connections: Moderately Isolated (05/29/2024)  Stress: Stress Concern Present (05/29/2024)  Tobacco Use: Low Risk  (05/30/2024)  Health Literacy: Adequate Health Literacy (08/26/2023)   Depression Screen    05/30/2024   11:40 AM 08/26/2023    9:31 AM 06/08/2023    2:03 PM 02/01/2023   11:15 AM 08/05/2022    9:39 AM 08/05/2022    9:02 AM 07/17/2021    2:37 PM  PHQ 2/9 Scores  PHQ - 2 Score 0 0 0 0 0 0 0  PHQ- 9 Score 0  0  0  0         Data saved with a previous flowsheet row definition     Goals Addressed   None    Visit info / Clinical Intake: Interpreter Needed?: No  Functional Status Activities of Daily Living (to include ambulation/medication): Independent Ambulation: Independent Medication Administration: Independent Home Management: Independent  Fall Screening Falls in the past year?: 0 Number of falls in past year: 0 Was there an injury with Fall?: 0 Fall Risk Category Calculator: 0 Patient Fall Risk Level: Low Fall Risk  Fall Risk Patient at Risk for Falls Due to: No Fall Risks Fall risk Follow up: Falls  evaluation completed  Cognitive Assessment What year is it?: 0 points What month is it?: 0 points Give patient an address phrase to remember (5 components): [address] [place] About what time is it?: 0 points Count backwards from 20 to 1: 0 points Say the months of the year in reverse: 0 points Repeat the address phrase from earlier: 2 points 6 CIT Score: 2 points  Advance Directives (For Healthcare) Does Patient Have a Medical Advance Directive?: No        Objective:    There were no vitals filed for this visit. There is no height or weight on file to calculate BMI.  Current  Medications (verified) Outpatient Encounter Medications as of 08/11/2024  Medication Sig   clonazePAM  (KLONOPIN ) 0.5 MG tablet Take 1 tablet (0.5 mg total) by mouth daily as needed.   doxylamine, Sleep, (UNISOM) 25 MG tablet Take 25 mg by mouth at bedtime as needed.   empagliflozin (JARDIANCE) 10 MG TABS tablet Take 1 tablet (10 mg total) by mouth daily before breakfast.   escitalopram  (LEXAPRO ) 10 MG tablet Take 1 tablet (10 mg total) by mouth daily.   glucose blood (ONETOUCH VERIO) test strip Use as directed to check blood sugars daily dx: e11.69   losartan  (COZAAR ) 25 MG tablet TAKE 1 TABLET BY MOUTH EVERY DAY   rosuvastatin  (CRESTOR ) 10 MG tablet TAKE 1 TABLET BY MOUTH EVERY DAY   No facility-administered encounter medications on file as of 08/11/2024.   Hearing/Vision screen No results found. Immunizations and Health Maintenance Health Maintenance  Topic Date Due   Zoster Vaccines- Shingrix  (1 of 2) Never done   Influenza Vaccine  05/05/2024   COVID-19 Vaccine (5 - 2025-26 season) 06/05/2024   Diabetic kidney evaluation - Urine ACR  02/02/2025   FOOT EXAM  02/02/2025   HEMOGLOBIN A1C  02/04/2025   Diabetic kidney evaluation - eGFR measurement  05/30/2025   OPHTHALMOLOGY EXAM  06/22/2025   Mammogram  08/08/2025   Medicare Annual Wellness (AWV)  08/11/2025   Colonoscopy  02/26/2027   DTaP/Tdap/Td (2 - Td or Tdap) 04/30/2027   Pneumococcal Vaccine: 50+ Years  Completed   DEXA SCAN  Completed   Hepatitis C Screening  Completed   Meningococcal B Vaccine  Aged Out        Assessment/Plan:  This is a routine wellness examination for [place].  Patient Care Team: Jarold Medici, MD as PCP - General (Internal Medicine) [facility], [facility])  I have personally reviewed and noted the following in the patient's chart:   Medical and social history Use of alcohol, tobacco or illicit drugs  Current medications and supplements including opioid prescriptions. Functional  ability and status Nutritional status Physical activity Advanced directives List of other physicians Hospitalizations, surgeries, and ER visits in previous 12 months Vitals Screenings to include cognitive, depression, and falls Referrals and appointments  No orders of the defined types were placed in this encounter.  In addition, I have reviewed and discussed with patient certain preventive protocols, quality metrics, and best practice recommendations. A written personalized care plan for preventive services as well as general preventive health recommendations were provided to patient.   Richerd ONEIDA Lemmings, CMA   08/11/2024   Return in 1 year (on 08/11/2025).  After Visit Summary: {CHL AMB AWV After Visit [phone]}  Nurse Notes: ***

## 2024-08-11 NOTE — Patient Instructions (Signed)

## 2024-08-17 ENCOUNTER — Other Ambulatory Visit: Payer: Self-pay | Admitting: Internal Medicine

## 2024-08-17 DIAGNOSIS — E7800 Pure hypercholesterolemia, unspecified: Secondary | ICD-10-CM

## 2024-08-23 ENCOUNTER — Ambulatory Visit: Payer: Self-pay | Admitting: Internal Medicine

## 2024-08-23 ENCOUNTER — Encounter: Payer: Self-pay | Admitting: Internal Medicine

## 2024-08-23 ENCOUNTER — Ambulatory Visit
Admission: RE | Admit: 2024-08-23 | Discharge: 2024-08-23 | Disposition: A | Discharge status: Home / Self Care | Payer: Self-pay | Source: Ambulatory Visit | Attending: Internal Medicine | Admitting: Internal Medicine

## 2024-08-23 DIAGNOSIS — E7800 Pure hypercholesterolemia, unspecified: Secondary | ICD-10-CM | POA: Insufficient documentation

## 2024-08-24 ENCOUNTER — Other Ambulatory Visit

## 2024-08-25 ENCOUNTER — Encounter: Payer: Self-pay | Admitting: Internal Medicine

## 2024-08-27 ENCOUNTER — Ambulatory Visit: Payer: Self-pay | Admitting: Internal Medicine

## 2024-09-15 ENCOUNTER — Ambulatory Visit: Payer: Self-pay

## 2024-09-15 DIAGNOSIS — Z0000 Encounter for general adult medical examination without abnormal findings: Secondary | ICD-10-CM

## 2024-09-15 NOTE — Progress Notes (Signed)
 No chief complaint on file.    Subjective:   Anne Woodard is a 68 y.o. female who presents for a Medicare Annual Wellness Visit.  Visit info / Clinical Intake: Medicare Wellness Visit Mode:: Telephone If telephone:: video error Since this visit was completed virtually, some vitals may be partially provided or unavailable. Missing vitals are due to the limitations of the virtual format.: Unable to obtain vitals - no equipment Patient Location:: home Provider Location:: office/ clinic Interpreter Needed?: No Pre-visit prep was completed: yes AWV questionnaire completed by patient prior to visit?: yes Date:: 09/14/24 Living arrangements:: (Patient-Rptd) lives with spouse/significant other Patient's Overall Health Status Rating: (Patient-Rptd) very good Typical amount of pain: (Patient-Rptd) some Does pain affect daily life?: (Patient-Rptd) no  Dietary Habits and Nutritional Risks How many meals a day?: (Patient-Rptd) 3 Eats fruit and vegetables daily?: (Patient-Rptd) yes Most meals are obtained by: (Patient-Rptd) preparing own meals  Functional Status Activities of Daily Living (to include ambulation/medication): Independent Ambulation: Independent Medication Administration: Independent Home Management (perform basic housework or laundry): Independent Manage your own finances?: yes Primary transportation is: driving Concerns about vision?: no *vision screening is required for WTM* Concerns about hearing?: no  Fall Screening Falls in the past year?: 1 Number of falls in past year: 1 Was there an injury with Fall?: 0 Fall Risk Category Calculator: 2 Patient Fall Risk Level: Moderate Fall Risk  Fall Risk Patient at Risk for Falls Due to: History of fall(s) Fall risk Follow up: Falls evaluation completed  Home and Transportation Safety: All rugs have non-skid backing?: yes All stairs or steps have railings?: yes Grab bars in the bathtub or shower?: (!) no Have  non-skid surface in bathtub or shower?: yes Good home lighting?: yes Regular seat belt use?: yes Hospital stays in the last year:: no  Cognitive Assessment Difficulty concentrating, remembering, or making decisions? : no Will 6CIT or Mini Cog be Completed: yes What year is it?: 0 points What month is it?: 0 points Give patient an address phrase to remember (5 components): [address] [place] what time is it?: 0 points Count backwards from 20 to 1: 0 points Say the months of the year in reverse: 0 points Repeat the address phrase from earlier: 2 points 6 CIT Score: 2 points  Advance Directives (For Healthcare) Does Patient Have a Medical Advance Directive?: Yes Does patient want to make changes to medical advance directive?: Yes (Inpatient - patient defers changing a medical advance directive at this time - Information given)  Reviewed/Updated  Reviewed/Updated: Reviewed All (Medical, Surgical, Family, Medications, Allergies, Care Teams, Patient Goals)    Allergies (verified) Patient has no known allergies.   Current Medications (verified) Outpatient Encounter Medications as of 09/15/2024  Medication Sig   clonazePAM  (KLONOPIN ) 0.5 MG tablet Take 1 tablet (0.5 mg total) by mouth daily as needed.   doxylamine, Sleep, (UNISOM) 25 MG tablet Take 25 mg by mouth at bedtime as needed.   empagliflozin  (JARDIANCE ) 10 MG TABS tablet Take 1 tablet (10 mg total) by mouth daily before breakfast.   escitalopram  (LEXAPRO ) 10 MG tablet Take 1 tablet (10 mg total) by mouth daily.   glucose blood (ONETOUCH VERIO) test strip Use as directed to check blood sugars daily dx: e11.69   losartan  (COZAAR ) 25 MG tablet TAKE 1 TABLET BY MOUTH EVERY DAY   rosuvastatin  (CRESTOR ) 10 MG tablet TAKE 1 TABLET BY MOUTH EVERY DAY   No facility-administered encounter medications on file as of 09/15/2024.  History: Past Medical History:  Diagnosis Date   Abnormal EKG 01/14/2018   Anxiety     Arthritis    Right hip   Cancer (HCC) 09/1998   Depression    Diabetes mellitus type 2 in obese 01/14/2018   Hypertension    Motion sickness    Boats   Multilevel degenerative disc disease    Pure hypercholesterolemia 01/14/2018   Vertigo    Past Surgical History:  Procedure Laterality Date   ABDOMINAL HYSTERECTOMY     CATARACT EXTRACTION W/PHACO Right 04/21/2021   Procedure: CATARACT EXTRACTION PHACO AND INTRAOCULAR LENS PLACEMENT (IOC) RIGHT DIABETIC;  Surgeon: Myrna Adine Anes, MD;  Location: [facility];  Service: Ophthalmology;  Laterality: Right;  1.85 00:18.0   CATARACT EXTRACTION W/PHACO Left 05/05/2021   Procedure: CATARACT EXTRACTION PHACO AND INTRAOCULAR LENS PLACEMENT (IOC) LEFT DIABETIC 2.31 00:21.0;  Surgeon: Myrna Adine Anes, MD;  Location: [facility];  Service: Ophthalmology;  Laterality: Left;   EYE SURGERY  062022   PARTIAL HYSTERECTOMY  2000   cervical cancer   Family History  Problem Relation Age of Onset   Colon cancer Mother    Endometrial cancer Mother    Hypertension Mother    Alzheimer's disease Father    Multiple sclerosis Son    Hypertension Son    Depression Son    Anxiety disorder Son    Breast cancer Neg Hx    Social History   Occupational History   Not on file  Tobacco Use   Smoking status: Never   Smokeless tobacco: Never   Tobacco comments:    Not applicable.   Vaping Use   Vaping status: Never Used  Substance and Sexual Activity   Alcohol use: Never   Drug use: Never   Sexual activity: Not Currently    Birth control/protection: None   Tobacco Counseling Counseling given: Yes Tobacco comments: Not applicable.   SDOH Screenings   Food Insecurity: No Food Insecurity (09/15/2024)  Housing: Unknown (09/15/2024)  Transportation Needs: No Transportation Needs (09/15/2024)  Utilities: Not At Risk (09/15/2024)  Alcohol Screen: Low Risk (09/15/2024)  Depression (PHQ2-9): Low Risk (09/15/2024)  Financial  Resource Strain: Low Risk (09/15/2024)  Physical Activity: Insufficiently Active (09/15/2024)  Social Connections: Socially Integrated (09/15/2024)  Stress: Stress Concern Present (09/15/2024)  Tobacco Use: Low Risk (09/15/2024)  Health Literacy: Adequate Health Literacy (09/15/2024)   See flowsheets for full screening details  Depression Screen PHQ 2 & 9 Depression Scale- Over the past 2 weeks, how often have you been bothered by any of the following problems? Little interest or pleasure in doing things: 0 Feeling down, depressed, or hopeless (PHQ Adolescent also includes...irritable): 0 PHQ-2 Total Score: 0 Trouble falling or staying asleep, or sleeping too much: 0 Feeling tired or having little energy: 0 Poor appetite or overeating (PHQ Adolescent also includes...weight loss): 0 Feeling bad about yourself - or that you are a failure or have let yourself or your family down: 0 Trouble concentrating on things, such as reading the newspaper or watching television (PHQ Adolescent also includes...like school work): 0 Moving or speaking so slowly that other people could have noticed. Or the opposite - being so fidgety or restless that you have been moving around a lot more than usual: 0 Thoughts that you would be better off dead, or of hurting yourself in some way: 0 PHQ-9 Total Score: 0 If you checked off any problems, how difficult have these problems made it for you to do your work, take  care of things at home, or get along with other people?: Not difficult at all     Goals Addressed   None          Objective:    Today's Vitals   There is no height or weight on file to calculate BMI.  Hearing/Vision screen No results found. Immunizations and Health Maintenance Health Maintenance  Topic Date Due   Zoster Vaccines- Shingrix  (1 of 2) Never done   Influenza Vaccine  05/05/2024   COVID-19 Vaccine (4 - 2025-26 season) 06/05/2024   Diabetic kidney evaluation - Urine ACR   02/02/2025   FOOT EXAM  02/02/2025   HEMOGLOBIN A1C  02/04/2025   Diabetic kidney evaluation - eGFR measurement  05/30/2025   OPHTHALMOLOGY EXAM  06/22/2025   Mammogram  08/08/2025   Medicare Annual Wellness (AWV)  09/15/2025   Colonoscopy  02/26/2027   DTaP/Tdap/Td (2 - Td or Tdap) 04/30/2027   Pneumococcal Vaccine: 50+ Years  Completed   Bone Density Scan  Completed   Hepatitis C Screening  Completed   Meningococcal B Vaccine  Aged Out        Assessment/Plan:  This is a routine wellness examination for [place].  Patient Care Team: Jarold Medici, MD as PCP - General (Internal Medicine) Pa, [facility])  I have personally reviewed and noted the following in the patients chart:   Medical and social history Use of alcohol, tobacco or illicit drugs  Current medications and supplements including opioid prescriptions. Functional ability and status Nutritional status Physical activity Advanced directives List of other physicians Hospitalizations, surgeries, and ER visits in previous 12 months Vitals Screenings to include cognitive, depression, and falls Referrals and appointments  No orders of the defined types were placed in this encounter.  In addition, I have reviewed and discussed with patient certain preventive protocols, quality metrics, and best practice recommendations. A written personalized care plan for preventive services as well as general preventive health recommendations were provided to patient.   Richerd ONEIDA Lemmings, CMA   09/15/2024   Return in 1 year (on 09/15/2025).  After Visit Summary: (MyChart) Due to this being a telephonic visit, the after visit summary with patients personalized plan was offered to patient via MyChart   Nurse Notes: Richerd Lemmings, CMA

## 2024-09-15 NOTE — Patient Instructions (Signed)

## 2024-09-19 ENCOUNTER — Encounter: Payer: Self-pay | Admitting: Internal Medicine

## 2024-09-19 ENCOUNTER — Ambulatory Visit: Payer: Self-pay | Admitting: Internal Medicine

## 2024-09-19 VITALS — BP 124/70 | HR 84 | Temp 98.9°F | Ht 63.0 in | Wt 189.0 lb

## 2024-09-19 DIAGNOSIS — E6609 Other obesity due to excess calories: Secondary | ICD-10-CM

## 2024-09-19 DIAGNOSIS — I2510 Atherosclerotic heart disease of native coronary artery without angina pectoris: Secondary | ICD-10-CM | POA: Insufficient documentation

## 2024-09-19 DIAGNOSIS — Z23 Encounter for immunization: Secondary | ICD-10-CM

## 2024-09-19 DIAGNOSIS — E1169 Type 2 diabetes mellitus with other specified complication: Secondary | ICD-10-CM

## 2024-09-19 MED ORDER — FLUCONAZOLE 150 MG PO TABS: ORAL_TABLET | ORAL | 0 refills | Status: AC

## 2024-09-19 NOTE — Patient Instructions (Signed)

## 2024-09-19 NOTE — Progress Notes (Unsigned)
 I,[name], CMA,acting as a neurosurgeon for [name], MD.,have documented all relevant documentation on the behalf of [name], MD,as directed by  [name], MD while in the presence of [name], MD.  Subjective:  Patient ID: Anne Woodard , female    DOB: Mar 03, 1956 , 68 y.o.   MRN: [id number]  Chief Complaint  Patient presents with   Diabetes    Patient presents today for a dm check. Patient reports compliance with her meds. Patient denies having any chest pain, sob or headaches at this time.    HPI Discussed the use of AI scribe software for clinical note transcription with the patient, who gave verbal consent to proceed.  History of Present Illness    Diabetes She presents for her follow-up diabetic visit. She has type 2 diabetes mellitus. Her disease course has been stable. There are no hypoglycemic associated symptoms. Pertinent negatives for hypoglycemia include no headaches. Pertinent negatives for diabetes include no chest pain, no polydipsia, no polyphagia, no polyuria and no weakness. There are no hypoglycemic complications. Symptoms are worsening. Risk factors for coronary artery disease include diabetes mellitus, hypertension, obesity, sedentary lifestyle and post-menopausal. Her breakfast blood glucose range is generally 110-130 mg/dl. Eye exam is not current.     Past Medical History:  Diagnosis Date   Abnormal EKG 01/14/2018   Anxiety    Arthritis    Right hip   Cancer (HCC) 09/1998   Depression    Diabetes mellitus type 2 in obese 01/14/2018   Hypertension    Motion sickness    Boats   Multilevel degenerative disc disease    Pure hypercholesterolemia 01/14/2018   Vertigo      Family History  Problem Relation Age of Onset   Colon cancer Mother    Endometrial cancer Mother    Hypertension Mother    Alzheimer's disease Father    Multiple sclerosis Son    Hypertension Son    Depression Son    Anxiety  disorder Son    Breast cancer Neg Hx     Current Medications[1]   Allergies[2]   Review of Systems  Constitutional: Negative.   Eyes: Negative.   Respiratory: Negative.    Cardiovascular: Negative.  Negative for chest pain.  Gastrointestinal: Negative.   Endocrine: Negative for polydipsia, polyphagia and polyuria.  Musculoskeletal: Negative.   Skin: Negative.   Neurological:  Negative for weakness and headaches.  Psychiatric/Behavioral: Negative.       Today's Vitals   09/19/24 1522  BP: 124/70  Pulse: 84  Temp: 98.9 F (37.2 C)  TempSrc: Oral  Weight: 189 lb (85.7 kg)  Height: 5' 3 (1.6 m)  PainSc: 0-No pain   Body mass index is 33.48 kg/m.  Wt Readings from Last 3 Encounters:  09/19/24 189 lb (85.7 kg)  05/30/24 196 lb (88.9 kg)  02/03/24 193 lb 3.2 oz (87.6 kg)    The 10-year ASCVD risk score (Arnett DK, et al., 2019) is: 20.8%   Values used to calculate the score:     Age: 65 years     Clinically relevant sex: Female     Is Non-Hispanic African American: No     Diabetic: Yes     Tobacco smoker: No     Systolic Blood Pressure: 124 mmHg     Is BP treated: Yes     HDL Cholesterol: 44 mg/dL     Total Cholesterol: 149 mg/dL  Objective:  Physical Exam Vitals and  nursing note reviewed.  Constitutional:      Appearance: Normal appearance.  HENT:     Head: Normocephalic and atraumatic.  Eyes:     Extraocular Movements: Extraocular movements intact.  Cardiovascular:     Rate and Rhythm: Normal rate and regular rhythm.     Heart sounds: Normal heart sounds.  Pulmonary:     Effort: Pulmonary effort is normal.     Breath sounds: Normal breath sounds.  Musculoskeletal:     Cervical back: Normal range of motion.  Skin:    General: Skin is warm.  Neurological:     General: No focal deficit present.     Mental Status: She is alert.  Psychiatric:        Mood and Affect: Mood normal.        Behavior: Behavior normal.         Assessment And Plan:    Assessment & Plan Dyslipidemia associated with type 2 diabetes mellitus (HCC)  Coronary artery calcification  Class 1 obesity due to excess calories with serious comorbidity and body mass index (BMI) of 33.0 to 33.9 in adult  Need for influenza vaccination   Assessment & Plan    Orders Placed This Encounter  Procedures   Flu vaccine HIGH DOSE PF(Fluzone Trivalent)     Return if symptoms worsen or fail to improve.  Patient was given opportunity to ask questions. Patient verbalized understanding of the plan and was able to repeat key elements of the plan. All questions were answered to their satisfaction.    I, [name], MD, have reviewed all documentation for this visit. The documentation on 09/19/2024 for the exam, diagnosis, procedures, and orders are all accurate and complete.   IF YOU HAVE BEEN REFERRED TO A SPECIALIST, IT MAY TAKE 1-2 WEEKS TO SCHEDULE/PROCESS THE REFERRAL. IF YOU HAVE NOT HEARD FROM US /SPECIALIST IN TWO WEEKS, PLEASE GIVE US  A CALL AT [phone].        [1]  Current Outpatient Medications:    clonazePAM  (KLONOPIN ) 0.5 MG tablet, Take 1 tablet (0.5 mg total) by mouth daily as needed., Disp: 30 tablet, Rfl: 1   doxylamine, Sleep, (UNISOM) 25 MG tablet, Take 25 mg by mouth at bedtime as needed., Disp: , Rfl:    empagliflozin  (JARDIANCE ) 10 MG TABS tablet, Take 1 tablet (10 mg total) by mouth daily before breakfast., Disp: 30 tablet, Rfl: 3   escitalopram  (LEXAPRO ) 10 MG tablet, Take 1 tablet (10 mg total) by mouth daily., Disp: 90 tablet, Rfl: 1   glucose blood (ONETOUCH VERIO) test strip, Use as directed to check blood sugars daily dx: e11.69, Disp: 100 each, Rfl: 2   losartan  (COZAAR ) 25 MG tablet, TAKE 1 TABLET BY MOUTH EVERY DAY, Disp: 90 tablet, Rfl: 2   rosuvastatin  (CRESTOR ) 10 MG tablet, TAKE 1 TABLET BY MOUTH EVERY DAY, Disp: 90 tablet, Rfl: 2 [2] No Known Allergies

## 2024-09-20 LAB — BMP8+EGFR
BUN/Creatinine Ratio: 20 (ref 12–28)
BUN: 14 mg/dL (ref 8–27)
CO2: 23 mmol/L (ref 20–29)
Calcium: 9.9 mg/dL (ref 8.7–10.3)
Chloride: 101 mmol/L (ref 96–106)
Creatinine, Ser: 0.71 mg/dL (ref 0.57–1.00)
Glucose: 177 mg/dL — ABNORMAL HIGH (ref 70–99)
Potassium: 4.1 mmol/L (ref 3.5–5.2)
Sodium: 140 mmol/L (ref 134–144)
eGFR: 91 mL/min/1.73 (ref >59)

## 2024-09-20 NOTE — Assessment & Plan Note (Signed)
 Chronic, LDL goal is less than 70. Managed with Jardiance  and rosuvastatin . Jardiance  dosing may be insufficient for A1c control. Insurance coverage for Jardiance  is uncertain. Metformin now possible with CKD stage 3. Jardiance  causes itching and increased urination with sweets. - Increase Jardiance  to daily dosing if tolerated. - Consider metformin with Jardiance  if needed. - Explore patient assistance programs for Jardiance . - Refilled fluconazole  for holiday use.

## 2024-09-20 NOTE — Assessment & Plan Note (Signed)
 She is encouraged to strive for BMI less than 30 to decrease cardiac risk. Advised to aim for at least 150 minutes of exercise per week.  Recent 7-pound weight loss likely due to Jardiance  and dietary changes. Encouraged to restrict sweets and increase physical activity. - Encouraged restriction of sweets to weekends.

## 2024-09-20 NOTE — Assessment & Plan Note (Signed)
 Calcium  score of 37 supports rosuvastatin  use for cardiovascular risk reduction. - Continue rosuvastatin  for cardiovascular risk reduction.

## 2024-09-24 ENCOUNTER — Ambulatory Visit: Payer: Self-pay | Admitting: Internal Medicine

## 2024-10-13 ENCOUNTER — Other Ambulatory Visit: Payer: Self-pay | Admitting: Internal Medicine

## 2025-02-06 ENCOUNTER — Encounter: Payer: Self-pay | Admitting: Internal Medicine
